# Patient Record
Sex: Male | Born: 1960 | Race: White | Hispanic: No | Marital: Married | Smoking: Never smoker
Health system: Southern US, Community
[De-identification: ages and names within clinical notes are randomized; demographics above are authoritative.]

## PROBLEM LIST (undated history)

## (undated) DIAGNOSIS — M1990 Unspecified osteoarthritis, unspecified site: Secondary | ICD-10-CM

## (undated) DIAGNOSIS — C7A8 Other malignant neuroendocrine tumors: Secondary | ICD-10-CM

## (undated) DIAGNOSIS — I10 Essential (primary) hypertension: Secondary | ICD-10-CM

## (undated) DIAGNOSIS — C7B8 Other secondary neuroendocrine tumors: Principal | ICD-10-CM

## (undated) HISTORY — PX: OTHER SURGICAL HISTORY: SHX169

## (undated) HISTORY — PX: HERNIA REPAIR: SHX51

---

## 2014-07-06 ENCOUNTER — Other Ambulatory Visit: Payer: Self-pay | Admitting: Hematology and Oncology

## 2014-07-06 DIAGNOSIS — C7A8 Other malignant neuroendocrine tumors: Secondary | ICD-10-CM

## 2014-07-06 DIAGNOSIS — C7B8 Other secondary neuroendocrine tumors: Principal | ICD-10-CM

## 2014-07-13 ENCOUNTER — Ambulatory Visit
Admission: RE | Admit: 2014-07-13 | Discharge: 2014-07-13 | Disposition: A | Discharge status: Home / Self Care | Payer: BC Managed Care – PPO | Source: Ambulatory Visit | Attending: Hematology and Oncology | Admitting: Hematology and Oncology

## 2014-07-13 DIAGNOSIS — C7A8 Other malignant neuroendocrine tumors: Secondary | ICD-10-CM

## 2014-07-13 DIAGNOSIS — C7B8 Other secondary neuroendocrine tumors: Principal | ICD-10-CM

## 2014-07-13 HISTORY — DX: Other secondary neuroendocrine tumors: C7B.8

## 2014-07-13 HISTORY — DX: Unspecified osteoarthritis, unspecified site: M19.90

## 2014-07-13 HISTORY — DX: Other malignant neuroendocrine tumors: C7A.8

## 2014-07-13 HISTORY — DX: Essential (primary) hypertension: I10

## 2014-07-13 NOTE — Consult Note (Signed)
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 Bilobar disease, slight interval progression by CT imaging despite Sandostatin. Assess for embolization therapy.   Referring Physician(s): Chinnasami,Bernard  History of Present Illness: Alex Adams is a 53 y.o. male with well-differentiated neuroendocrine tumor to the liver. He is currently receiving monthly Sandostatin injections. Overall he is doing very well. Recent MRI demonstrates multiple hepatic metastases. Patent portal vein. No biliary dilatation. The hepatic lesions demonstrate slight interval enlargement compared to prior CTs despite Sandostatin. He denies any current symptoms and remains asymptomatic.  Past Medical History  Diagnosis Date  . 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 Hypertension   . Arthritis     Past Surgical History  Procedure Laterality Date  . Hernia repair      Inguinal Hernia  . Arthroscopy of left knee with medial meniscus repair      Allergies: Dilaudid  Medications: Prior to Admission medications   Not on File    No family history on file.  History   Social History  . Marital Status: Married    Spouse Name: N/A    Number of Children: N/A  . Years of Education: N/A   Social History Main Topics  . Smoking status: Never Smoker   . Smokeless tobacco: Never Used  . Alcohol Use: No  . Drug Use: No  . Sexual Activity: Not on file   Other Topics Concern  . Not on file   Social History Narrative  . No narrative on file    ECOG Status: 0 - Asymptomatic  Review of Systems: A 12 point ROS discussed and pertinent positives are indicated in the HPI above.  All other systems are negative.  Review of Systems  Constitutional: Negative for fever, chills, diaphoresis, activity change and appetite change.  Respiratory: Negative for cough, chest tightness and shortness of breath.   Cardiovascular: Negative for chest pain and palpitations.    Gastrointestinal: Negative for abdominal distention.  Neurological: Negative for dizziness, weakness and headaches.    Vital Signs: BP 140/87  Pulse 91  Temp(Src) 97.3 F (36.3 C) (Oral)  Resp 14  Ht 5\' 6"  (1.676 m)  Wt 245 lb (111.131 kg)  BMI 39.56 kg/m2  SpO2 98%  Physical Exam  Constitutional: He appears well-developed and well-nourished. No distress.  Cardiovascular: Normal rate and regular rhythm.  Exam reveals no friction rub.   No murmur heard. Pulmonary/Chest: Effort normal and breath sounds normal. No respiratory distress. He has no wheezes.  Abdominal: Soft. Bowel sounds are normal. He exhibits no distension.  No palpable mass or organomegaly. No hernia.  Skin: He is not diaphoretic.  Psychiatric: He has a normal mood and affect.    Imaging: Most recent MRI and CT scans were reviewed with the patient and his wife. This demonstrates bilobar disease. Largest lesion is in the central right hepatic lobe.  Patent portal vein. No biliary dilatation. No adenopathy. Labs: Available has from 03/30/2014 creatinine 0.9. Normal LFTs. Total bilirubin 0.6. Total protein 6.8.  Assessment and Plan:  Well-differentiated neuroendocrine tumor to the liver with slight progression of bilobar hepatic metastases by imaging despite Sandostatin therapy. Based on imaging and lab data, the patient is an excellent candidate for Y90 embolization therapy. This procedure was discussed in detail including the preprocedure assessment as well as the treatment. He and his wife have a clear understanding of the procedure and and wish to proceed. Other embolization therapies were also discussed. After our discussion, I plan  to schedule for Y 90 embolization in the next few weeks at [facility].  Thank you for this interesting consult.  I greatly enjoyed meeting Alex Adams and look forward to participating in their care.    I spent a total of 30 minutes face to face in clinical  consultation, greater than 50% of which was counseling/coordinating care for metastatic neuroendocrine tumor.  SignedGreggory Keen T. 07/13/2014, 3:22 PM

## 2014-07-19 ENCOUNTER — Telehealth: Payer: Self-pay | Admitting: Emergency Medicine

## 2014-07-19 NOTE — Telephone Encounter (Signed)
CALLED PT TO MAKE HIM AWARE THAT INS. HAS APPROVED Y-90 PROCEDURE.   EVERYTHING HAS BEEN FAXED TO [facility] AND THEY WILL CONTACT PT DIRECTLY TO SET UP PROCEDURE.

## 2014-08-23 ENCOUNTER — Other Ambulatory Visit: Payer: Self-pay | Admitting: Interventional Radiology

## 2014-08-25 ENCOUNTER — Other Ambulatory Visit: Payer: Self-pay | Admitting: Radiology

## 2014-08-25 DIAGNOSIS — D3A8 Other benign neuroendocrine tumors: Secondary | ICD-10-CM

## 2014-08-30 ENCOUNTER — Other Ambulatory Visit: Payer: Self-pay | Admitting: Radiology

## 2014-08-30 DIAGNOSIS — C7B8 Other secondary neuroendocrine tumors: Secondary | ICD-10-CM | POA: Insufficient documentation

## 2014-09-06 ENCOUNTER — Inpatient Hospital Stay: Admission: RE | Admit: 2014-09-06 | Payer: BC Managed Care – PPO | Source: Ambulatory Visit

## 2014-09-06 ENCOUNTER — Other Ambulatory Visit: Payer: Self-pay | Admitting: Radiology

## 2014-09-28 ENCOUNTER — Other Ambulatory Visit: Payer: Self-pay | Admitting: Radiology

## 2014-09-28 DIAGNOSIS — C7B8 Other secondary neuroendocrine tumors: Secondary | ICD-10-CM

## 2014-09-29 ENCOUNTER — Other Ambulatory Visit: Payer: Self-pay | Admitting: Emergency Medicine

## 2014-09-29 DIAGNOSIS — C7B8 Other secondary neuroendocrine tumors: Principal | ICD-10-CM

## 2014-09-29 DIAGNOSIS — C7A8 Other malignant neuroendocrine tumors: Secondary | ICD-10-CM

## 2014-11-01 ENCOUNTER — Ambulatory Visit
Admission: RE | Admit: 2014-11-01 | Discharge: 2014-11-01 | Disposition: A | Discharge status: Home / Self Care | Payer: BC Managed Care – PPO | Source: Ambulatory Visit | Attending: Radiology | Admitting: Radiology

## 2014-11-01 DIAGNOSIS — C7B8 Other secondary neuroendocrine tumors: Secondary | ICD-10-CM

## 2014-11-01 HISTORY — PX: IR GENERIC HISTORICAL: IMG1180011

## 2014-11-01 NOTE — Progress Notes (Signed)
Patient ID: Alex Adams, male   DOB: 18-Mar-1961, 54 y.o.   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 Bilateral disease. Patient is a nonsurgical candidate. He is now status post right and left hepatic Y 90 radio embolizations. He returns for outpatient follow-up.  Referring Physician(s): Bruning,Kevin, Chinnasami, Bernard  History of Present Illness: Alex Adams is a 54 y.o. male 54 year old male with well-differentiated neuroendocrine tumor to the liver. He is status post right and left hepatic Y 90 radio embolizations performed at [facility] on 08/11/2014, 08/24/2014, and 09/27/2014. Both Y 90 embolizations required overnight observation admissions for hydration, pain management, and control of nausea. After the first right hepatic Y 90 embolization, he had a mild radiation-induced cholecystitis which resolved with conservative management including hydration, pain medication, and antibiotics. Patient has known cholelithiasis. The mild cholecystitis resolved prior to the left hepatic Y 90 embolization. He has recovered over the holidays however, on 10/23/2014 he had right upper quadrant pain compatible with biliary colic or symptomatic gallstones. He presented to the emergency room. CT at that time demonstrated no acute inflammatory process. Hepatic lesions are stable. No extrahepatic disease. He was discharged with nausea and pain medications.  Currently he describes intermittent right upper quadrant abdominal pain with meals despite a low-fat diet. Otherwise no significant abdominal pain related to the Y 90 embolizations. He reports 15 pound weight loss over the course of the embolization treatments. No interval fevers. 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  At this point his metastatic neuroendocrine tumor within the liver is stable following the bilateral Y 90 embolizations. He warrants no  further embolization at this point. For the neuroendocrine tumor, I plan to obtain a follow-up abdominal MRI in 4 months (May 2016).  Past Medical History  Diagnosis Date  . 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 Hypertension   . Arthritis     Past Surgical History  Procedure Laterality Date  . Hernia repair      Inguinal Hernia  . Arthroscopy of left knee with medial meniscus repair      Allergies: Dilaudid  Medications: Prior to Admission medications   Not on File    No family history on file.  History   Social History  . Marital Status: Married    Spouse Name: N/A    Number of Children: N/A  . Years of Education: N/A   Social History Main Topics  . Smoking status: Never Smoker   . Smokeless tobacco: Never Used  . Alcohol Use: No  . Drug Use: No  . Sexual Activity: Not on file   Other Topics Concern  . Not on file   Social History Narrative  . No narrative on file    ECOG Status: 0 - Asymptomatic  Review of Systems  Constitutional: Positive for appetite change. Negative for fever, diaphoresis, activity change and fatigue.  Respiratory: Negative for cough, chest tightness and shortness of breath.   Cardiovascular: Negative for chest pain and palpitations.  Gastrointestinal: Negative.  Negative for abdominal distention.  Genitourinary: Negative for dysuria and flank pain.  Psychiatric/Behavioral: Negative for agitation.    Vital Signs: BP 127/81 mmHg  Pulse 82  Temp(Src) 97.6 F (36.4 C) (Oral)  Resp 15  Ht 5\' 6"  (1.676 m)  Wt 231 lb (104.781 kg)  BMI 37.30 kg/m2  SpO2 99%  Physical Exam  Constitutional: He appears well-developed and well-nourished. No distress.  Cardiovascular: Normal rate, regular rhythm, normal heart sounds and intact distal pulses.  Exam reveals no friction rub.   No murmur heard. Pulmonary/Chest: Effort normal. No respiratory distress. He has no wheezes. He has rales.  Abdominal: Soft. Bowel sounds are normal.  He exhibits no distension.  No hepatosplenomegaly. No palpable mass.  Skin: Skin is warm and dry. No rash noted. He is not diaphoretic. No erythema. No pallor.  Psychiatric: He has a normal mood and affect. His behavior is normal. Judgment and thought content normal.    Imaging: [place] [facility] 10/23/2014 abdominal CT with contrast demonstrates stable hepatic lesions following Y 90 embolizations. Cholelithiasis noted. No current acute abdominal or pelvic inflammatory process.   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 Stable CT imaging without progression or extrahepatic disease. History of known gallstones. Embolization procedure was complicated by mild radiation-induced cholecystitis treated conservatively. He now has intermittent abdominal pain with meals in the right upper quadrant compatible with biliary colic or intermittent systematic gallstones. He has been referred to Dr. Christian Mate in [facility] to be evaluated for cholecystectomy. I called Dr. Christian Mate today to discuss his case over the phone.  For his neuroendocrine tumor, he needs surveillance imaging in 4 months in May 2016 with a abdominal MRI without and with contrast.   I spent a total of 30 minutes face to face in clinical consultation, greater than 50% of which was counseling/coordinating care for metastatic neuroendocrine tumor.  SignedGreggory Keen 11/01/2014, 12:29 PM

## 2015-02-13 ENCOUNTER — Other Ambulatory Visit (HOSPITAL_COMMUNITY): Payer: Self-pay | Admitting: Interventional Radiology

## 2015-02-13 ENCOUNTER — Other Ambulatory Visit: Payer: Self-pay | Admitting: *Deleted

## 2015-02-13 DIAGNOSIS — C7B8 Other secondary neuroendocrine tumors: Secondary | ICD-10-CM

## 2015-02-13 DIAGNOSIS — D3A8 Other benign neuroendocrine tumors: Secondary | ICD-10-CM

## 2015-03-14 ENCOUNTER — Ambulatory Visit
Admission: RE | Admit: 2015-03-14 | Discharge: 2015-03-14 | Disposition: A | Discharge status: Home / Self Care | Payer: BC Managed Care – PPO | Source: Ambulatory Visit | Attending: Interventional Radiology | Admitting: Interventional Radiology

## 2015-03-14 DIAGNOSIS — C7B8 Other secondary neuroendocrine tumors: Secondary | ICD-10-CM

## 2015-03-14 NOTE — Progress Notes (Signed)
Patient ID: Alex Adams, male   DOB: May 30, 1961, 54 y.o.   MRN: [id number]      Chief Complaint: Chief Complaint  Patient presents with  . Follow-up    follow up Y-90 S[facility] for liver mets    Referring Physician(s): Chinnasami  History of Present Illness: Alex Adams is a 54 y.o. male with well-differentiated neuroendocrine tumor to the liver. He is status post bilateral hepatic Y 90 radio embolizations performed at [facility] 08/24/2014 and 09/27/2014. He has recovered very well. He did develop mild radiation-induced cholecystitis which initially was treated with conservative management. He eventually elected to undergo elective outpatient laparoscopic cholecystectomy. His abdominal pain has resolved. He currently is asymptomatic. No physical limitations. He works full-time. Follow-up imaging demonstrates regression of the hepatic disease. No new or enlarging hepatic lesions. No extra hepatic disease. Currently he is stable on monthly Sandostatin injections. No new issues.  Past Medical History  Diagnosis Date  . 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 Hypertension   . Arthritis     Past Surgical History  Procedure Laterality Date  . Hernia repair      Inguinal Hernia  . Arthroscopy of left knee with medial meniscus repair      Allergies: Dilaudid  Medications: Prior to Admission medications   Not on File     No family history on file.  History   Social History  . Marital Status: Married    Spouse Name: N/A  . Number of Children: N/A  . Years of Education: N/A   Social History Main Topics  . Smoking status: Never Smoker   . Smokeless tobacco: Never Used  . Alcohol Use: No  . Drug Use: No  . Sexual Activity: Not on file   Other Topics Concern  . Not on file   Social History Narrative  . No narrative on file    ECOG Status: 0 - Asymptomatic  Review of Systems: A 12 point ROS discussed and pertinent positives are indicated in the HPI  above.  All other systems are negative.  Review of Systems  Constitutional: Positive for fatigue. Negative for fever, diaphoresis, activity change and appetite change.  Cardiovascular: Negative for chest pain and palpitations.  Gastrointestinal: Negative for abdominal distention.    Vital Signs: BP 136/91 mmHg  Pulse 72  Temp(Src) 97.9 F (36.6 C) (Oral)  Resp 14  Ht 5\' 6"  (1.676 m)  Wt 240 lb (108.863 kg)  BMI 38.76 kg/m2  SpO2 98%  Physical Exam  Constitutional: He appears well-developed and well-nourished. No distress.  Abdominal: Soft. Bowel sounds are normal. He exhibits no distension.  Skin: He is not diaphoretic.    Imaging: MRI abdomen 03/07/2015 demonstrates decrease in size of the dominant right hepatic lesion with residual peripheral enhancement. Other bilateral hepatic lesions are also smaller in size. No new or enlarging hepatic disease. No extrahepatic disease demonstrated. Interval cholecystectomy without complication.  Labs:  CBC: WBC 4.4, hemoglobin 15.8, platelet count 208 (01/31/2015)  BMP: Within normal limits. Creatinine 0.8. Glucose 135. (01/31/2015)  LIVER FUNCTION TESTS: Total bilirubin 0.8, normal LFTs. (01/31/2015)  TUMOR MARKERS: Chromogranin a 1.0, serotonin 307 (02/02/2015)  Assessment and Plan:  6 months status post bilateral Y 90 embolization therapy for well-differentiated neuroendocrine tumor to the liver. Follow-up posttreatment MRI imaging demonstrates regression of the dominant right hepatic mass. Small left hepatic lesions are also decreased in size. No new or enlarging hepatic disease. No extrahepatic disease appreciated. Since our last visit, the  patient elected to undergo elective laparoscopic cholecystectomy. He has recovered from this very well. No further biliary colic or abdominal pain. Overall he is doing very well. No physical limitations. He remains asymptomatic. Stable lab values. He remains on monthly Sandostatin injections.  He is followed closely by Dr. Wendee Beavers  No further treatment at this time. Continue surveillance imaging every 4 months with abdominal MRI without and with contrast.  Signed: Tera Pellicane 03/14/2015, 9:26 AM   I spent a total of    25 Minutes in face to face in clinical consultation, greater than 50% of which was counseling/coordinating care for this patient with metastatic neuroendocrine tumor to the liver.

## 2015-06-21 ENCOUNTER — Other Ambulatory Visit: Payer: Self-pay | Admitting: Radiology

## 2015-06-21 ENCOUNTER — Other Ambulatory Visit: Payer: Self-pay | Admitting: Interventional Radiology

## 2015-06-21 DIAGNOSIS — D3A8 Other benign neuroendocrine tumors: Secondary | ICD-10-CM

## 2015-06-23 ENCOUNTER — Other Ambulatory Visit: Payer: Self-pay | Admitting: *Deleted

## 2015-06-23 DIAGNOSIS — D3A8 Other benign neuroendocrine tumors: Secondary | ICD-10-CM

## 2015-07-18 ENCOUNTER — Ambulatory Visit
Admission: RE | Admit: 2015-07-18 | Discharge: 2015-07-18 | Disposition: A | Discharge status: Home / Self Care | Payer: BC Managed Care – PPO | Source: Ambulatory Visit | Attending: Interventional Radiology | Admitting: Interventional Radiology

## 2015-07-18 DIAGNOSIS — D3A8 Other benign neuroendocrine tumors: Secondary | ICD-10-CM | POA: Insufficient documentation

## 2015-07-18 NOTE — Progress Notes (Signed)
Patient ID: Alex Adams, male   DOB: May 06, 1961, 54 y.o.   MRN: [id number]       Chief Complaint: Patient was seen in consultation today for  Chief Complaint  Patient presents with  . Follow-up    follow up Y-90 SIRT     at the request of Shick,Michael  Referring Physician(s): chinnasami  History of Present Illness: Alex Adams is a 54 y.o. male with well-differentiated neuroendocrine tumor to the liver. He is now status post bilateral hepatic Y 90 radio embolizations performed at [facility] 08/24/2014 and 09/27/2014. His recovery was complicated by mild radiation-induced cholecystitis which initially was treated conservatively. He did eventually require an elective outpatient laparoscopic cholecystectomy. No further abdominal pain or symptoms. He continues to be asymptomatic. He works full-time and has no physical limitations. Surveillance MRI imaging demonstrates stable hepatic disease posttreatment. No new or enlarging lesions. No evidence of extra hepatic disease. He has been compliant with monthly Sandostatin injections. No flushing, tachycardia, or diarrhea.  Past Medical History  Diagnosis Date  . 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 Hypertension   . Arthritis     Past Surgical History  Procedure Laterality Date  . Hernia repair      Inguinal Hernia  . Arthroscopy of left knee with medial meniscus repair      Allergies: Dilaudid  Medications: Prior to Admission medications   Not on File     No family history on file.  Social History   Social History  . Marital Status: Married    Spouse Name: N/A  . Number of Children: N/A  . Years of Education: N/A   Social History Main Topics  . Smoking status: Never Smoker   . Smokeless tobacco: Never Used  . Alcohol Use: No  . Drug Use: No  . Sexual Activity: Not on file   Other Topics Concern  . Not on file   Social History Narrative  . No narrative on file    ECOG Status: 0 -  Asymptomatic  Review of Systems: A 12 point ROS discussed and pertinent positives are indicated in the HPI above.  All other systems are negative.  Review of Systems  Constitutional: Negative for fever, chills, diaphoresis, activity change, appetite change, fatigue and unexpected weight change.  Respiratory: Negative for cough, chest tightness and shortness of breath.   Cardiovascular: Negative for chest pain.  Gastrointestinal: Negative for nausea, vomiting, abdominal pain, diarrhea and abdominal distention.    Vital Signs: BP 145/88 mmHg  Pulse 73  Temp(Src) 97.9 F (36.6 C) (Oral)  Resp 15  SpO2 99%  Physical Exam  Constitutional: He appears well-developed and well-nourished. No distress.  Eyes: Conjunctivae are normal. No scleral icterus.  Cardiovascular: Normal rate, regular rhythm, normal heart sounds and intact distal pulses.   No murmur heard. Pulmonary/Chest: Effort normal and breath sounds normal. No respiratory distress. He exhibits no tenderness.  Abdominal: Soft. Bowel sounds are normal. He exhibits no distension and no mass. There is no tenderness. No hernia.  Skin: Skin is warm and dry. No rash noted. He is not diaphoretic. No erythema.  Psychiatric: He has a normal mood and affect. His behavior is normal. Judgment and thought content normal.    Imaging: MRI 07/11/2015 demonstrates stable hepatic metastases without interval change compared to 03/07/2015. Dominant right hepatic central lesion has residual peripheral enhancement. No disease progression. No extrahepatic disease.  Labs: 06/27/2015: Creatinine 0.85, bilirubin 0.8, AST 39, ALT 46, alkaline phosphatase 106,  hemoglobin 15.8, platelet count 158  Assessment and Plan:  Metastatic carcinoid tumor to the liver, status post hepatic Y 90 radio embolizations 08/24/2014 and 09/27/2014. Surveillance imaging demonstrates stable hepatic disease without any new or enlarging lesions. No extra hepatic disease. He  remains stable on monthly Sandostatin injections. No current abdominal pain or symptoms.  He will remain on monthly Sandostatin injections. He continues to be followed closely by Dr. Ferne Coe centimeters.  No further embolization therapy necessary at this time. Continue close surveillance imaging every 4 months with abdominal MRI without and with contrast.  Signed: SHICK, MICHAEL 07/18/2015, 8:34 AM   I spent a total of    25 Minutes in face to face in clinical consultation, greater than 50% of which was counseling/coordinating care for with metastatic neuroendocrine tumor to the liver.

## 2015-11-01 ENCOUNTER — Other Ambulatory Visit: Payer: Self-pay | Admitting: Interventional Radiology

## 2015-11-01 DIAGNOSIS — D3A8 Other benign neuroendocrine tumors: Secondary | ICD-10-CM

## 2015-12-05 ENCOUNTER — Ambulatory Visit
Admission: RE | Admit: 2015-12-05 | Discharge: 2015-12-05 | Disposition: A | Discharge status: Home / Self Care | Payer: BC Managed Care – PPO | Source: Ambulatory Visit | Attending: Interventional Radiology | Admitting: Interventional Radiology

## 2015-12-05 DIAGNOSIS — D3A8 Other benign neuroendocrine tumors: Secondary | ICD-10-CM

## 2015-12-05 NOTE — Progress Notes (Signed)
Patient ID: Alex Adams, male   DOB: 1961-03-20, 55 y.o.   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    Referring Physician(s): Chinnasami  History of Present Illness: Alex Adams is a 55 y.o. 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 He is status post bilateral hepatic Y 90 radio embolizations performed at [facility] 08/24/2014 and 09/27/2014 area he returns for outpatient follow-up and review his surveillance imaging. Since his last visit he remains asymptomatic. No current abdominal pain or flank pain. No physical limitations. He continues to work full-time. No episodes of tachycardia, flushing, or diarrhea. Follow-up imaging continues to be stable with no progression of hepatic disease. No new or enlarging disease or extra hepatic disease. He remains on monthly Sandostatin injections. No new issues or complaints.  Past Medical History  Diagnosis Date  . 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 Hypertension   . Arthritis     Past Surgical History  Procedure Laterality Date  . Hernia repair      Inguinal Hernia  . Arthroscopy of left knee with medial meniscus repair      Allergies: Dilaudid  Medications: Prior to Admission medications   Not on File     No family history on file.  Social History   Social History  . Marital Status: Married    Spouse Name: N/A  . Number of Children: N/A  . Years of Education: N/A   Social History Main Topics  . Smoking status: Never Smoker   . Smokeless tobacco: Never Used  . Alcohol Use: No  . Drug Use: No  . Sexual Activity: Not on file   Other Topics Concern  . Not on file   Social History Narrative  . No narrative on file    ECOG Status: 0 - Asymptomatic  Review of Systems: A 12 point ROS discussed and pertinent positives are indicated in the HPI above.  All other systems are negative.  Review of Systems  Vital Signs: BP  130/88 mmHg  Pulse 78  Temp(Src) 97.8 F (36.6 C) (Oral)  Resp 14  Ht 5\' 6"  (1.676 m)  Wt 250 lb (113.399 kg)  BMI 40.37 kg/m2  Physical Exam  Constitutional: He appears well-developed and well-nourished. No distress.  Cardiovascular: Normal rate and regular rhythm.  Exam reveals no friction rub.   No murmur heard. Pulmonary/Chest: Effort normal. No respiratory distress. He has no wheezes. He has no rales.  Abdominal: Soft. Bowel sounds are normal. He exhibits no distension. There is no tenderness.  Skin: Skin is warm and dry. No rash noted. He is not diaphoretic. No erythema.     Imaging: Abdominal MRI with contrast 10/13/2015 demonstrates stable residual hepatic lesions. No new or enlarging hepatic abnormality. No extra hepatic disease. No significant change compared to the previous exams over the last year.   Labs:  CBC: No results for input(s): WBC, HGB, HCT, PLT in the last 8760 hours.  COAGS: No results for input(s): INR, APTT in the last 8760 hours.  BMP: No results for input(s): NA, K, CL, CO2, GLUCOSE, BUN, CALCIUM, CREATININE, GFRNONAA, GFRAA in the last 8760 hours.  Invalid input(s): CMP  LIVER FUNCTION TESTS: No results for input(s): BILITOT, AST, ALT, ALKPHOS, PROT, ALBUMIN in the last 8760 hours.  TUMOR MARKERS: No results for input(s): AFPTM, CEA, CA199, CHROMGRNA in the last 8760 hours.  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 Follow-up posttreatment MRI imaging has been performed every 4 months. No significant change in the dominant central right hepatic mass as well as other small bilateral hepatic lesions. No new or enlarging hepatic disease. No extrahepatic disease appreciated. Overall he is at his baseline. No current biliary colic or abdominal pain. No physical limitations. He remains asymptomatic. He continues on monthly Sandostatin injections. He is closely  followed by Dr. Wendee Beavers.  Plan: Continue surveillance imaging every 6 months with abdominal MRI without and with contrast. No further radio embolization of this time.    Electronically Signed: Greggory Keen 12/05/2015, 4:32 PM   I spent a total of    25 Minutes in face to face in clinical consultation, greater than 50% of which was counseling/coordinating care for this patient with metastatic neuroendocrine tumor to the liver.

## 2016-05-08 ENCOUNTER — Other Ambulatory Visit (HOSPITAL_COMMUNITY): Payer: Self-pay | Admitting: Interventional Radiology

## 2016-05-08 ENCOUNTER — Other Ambulatory Visit: Payer: Self-pay | Admitting: Radiology

## 2016-05-08 DIAGNOSIS — C7B8 Other secondary neuroendocrine tumors: Secondary | ICD-10-CM

## 2016-05-08 DIAGNOSIS — C787 Secondary malignant neoplasm of liver and intrahepatic bile duct: Secondary | ICD-10-CM

## 2016-05-21 ENCOUNTER — Telehealth: Payer: Self-pay | Admitting: Radiology

## 2016-05-21 NOTE — Telephone Encounter (Signed)
Phoned patient with appointment info:  MR abd w/ & w/o contrast:  Wed, May 29, 2016 at 9:15 am at [facility].  To arrive at 8:45 am.  Liqs only x 4 hrs prior.  May take routine medications.  Appointment with Dr. Annamaria BootsBeatris Ship, Jun 04, 2016 at 8 am.  To arrive at 7:45 am.    Reece Levy, RN 05/21/2016 8:32 AM

## 2016-06-04 ENCOUNTER — Ambulatory Visit
Admission: RE | Admit: 2016-06-04 | Discharge: 2016-06-04 | Disposition: A | Discharge status: Home / Self Care | Payer: BC Managed Care – PPO | Source: Ambulatory Visit | Attending: Interventional Radiology | Admitting: Interventional Radiology

## 2016-06-04 DIAGNOSIS — C7B8 Other secondary neuroendocrine tumors: Secondary | ICD-10-CM

## 2016-06-04 HISTORY — PX: IR GENERIC HISTORICAL: IMG1180011

## 2016-06-04 NOTE — Progress Notes (Signed)
Patient ID: Alex Adams, male   DOB: Oct 29, 1960, 55 y.o.   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 Status post Y 90 radio embolization.  Referring Physician(s): Chinnasami  History of Present Illness: Alex Adams is a 55 y.o. 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 He is well-known to our service. He is status post bilateral hepatic Y 90 radio embolizations performed at [facility] 08/24/2014 and 09/27/2014. His embolizations were complicated by cholecystitis. This ultimately require cholecystectomy. He now returns for outpatient follow-up and review of his surveillance imaging. He continues to do very well. Excellent functional status. ECOG status 0. He remains asymptomatic. No current abdominal pain, flank pain, jaundice, flushing, rapid heart rate, or diarrhea. No physical limitations. Stable weight and he continues to work full-time. Surveillance MRI imaging continues to demonstrate stable hepatic disease. No interval change or progression. No new or enlarging lesions are extrahepatic disease. 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 He continues on Sandostatin injections monthly. No new issues or complaints. He has traveled extensively this summer.  Past Medical History:  Diagnosis Date  . Arthritis   . Hypertension   . Neuroendocrine carcinoma metastatic to liver [facility])     Past Surgical History:  Procedure Laterality Date  . Arthroscopy of Left Knee with Medial Meniscus Repair    . HERNIA REPAIR     Inguinal Hernia    Allergies: Dilaudid [hydromorphone hcl]  Medications: Prior to Admission medications   Medication Sig Start Date End Date Taking? Authorizing Provider  sildenafil (REVATIO) 20 MG tablet 2-5 tabs po 1-2 hrs before sex prn 02/20/16  Yes Historical Provider, MD     No family history on file.  Social History   Social History  . Marital status:  Married    Spouse name: N/A  . Number of children: N/A  . Years of education: N/A   Social History Main Topics  . Smoking status: Never Smoker  . Smokeless tobacco: Never Used  . Alcohol use No  . Drug use: No  . Sexual activity: Not Asked   Other Topics Concern  . None   Social History Narrative  . None    ECOG Status: 0 - Asymptomatic  Review of Systems: A 12 point ROS discussed and pertinent positives are indicated in the HPI above.  All other systems are negative.  Review of Systems  Vital Signs: BP 131/90 (BP Location: Right Arm, Patient Position: Sitting, Cuff Size: Normal)   Pulse 71   Temp 97.8 F (36.6 C)   Resp 14   Ht 5\' 6"  (1.676 m)   Wt 245 lb (111.1 kg)   SpO2 98%   BMI 39.54 kg/m   Physical Exam  Constitutional: He is oriented to person, place, and time. He appears well-developed and well-nourished. No distress.  Eyes: No scleral icterus.  Neck: Normal range of motion. Neck supple.  Cardiovascular: Normal rate, regular rhythm, normal heart sounds and intact distal pulses.  Exam reveals no gallop and no friction rub.   No murmur heard. Pulmonary/Chest: Effort normal and breath sounds normal. No respiratory distress. He has no wheezes. He has no rales.  Abdominal: Soft. Bowel sounds are normal. He exhibits no distension. There is no tenderness. There is no guarding.  Lymphadenopathy:    He has no cervical adenopathy.  Neurological: He is alert and oriented to person, place, and time.  Skin: He is not diaphoretic.  Psychiatric: He has a normal mood and affect. His behavior is normal. Thought content normal.     Imaging:  MRI imaging with contrast performed at [facility] 05/29/2016. This demonstrated no change in the hypervascular liver lesion in the right lobe. No new liver lesions. Stable hepatic steatosis with interval progression of cirrhosis and hepatic fibrosis. No extrahepatic disease. Labs:  Available labs from 04/08/2016:   LFTs within  normal limits. Total bilirubin 0.6.  Chromogranin a 28 previously 26. Serotonin 294, previously 335   Assessment and Plan:  1.5 year status post bilateral Y 90 radio embolization for well-differentiated neuroendocrine tumor to the liver. Follow-up MRI demonstrates stable hepatic disease with progression of hepatic cirrhosis and fibrosis. No new or enlarging hepatic disease. No extrahepatic disease appreciated. He remains asymptomatic and at his baseline.  Plan: Continue monthly Sandostatin injections and close follow-up with Dr.Chinnasami.  Continue surveillance imaging every 6 months with abdominal MRI without and with contrast. No indication for additional radio embolization at this time.  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

## 2016-10-23 ENCOUNTER — Encounter: Payer: Self-pay | Admitting: Interventional Radiology

## 2016-10-24 ENCOUNTER — Encounter: Payer: Self-pay | Admitting: Interventional Radiology

## 2016-11-19 ENCOUNTER — Other Ambulatory Visit (HOSPITAL_COMMUNITY): Payer: Self-pay | Admitting: Interventional Radiology

## 2016-11-19 DIAGNOSIS — C7A8 Other malignant neuroendocrine tumors: Secondary | ICD-10-CM

## 2016-11-19 DIAGNOSIS — C7B8 Other secondary neuroendocrine tumors: Principal | ICD-10-CM

## 2020-04-28 ENCOUNTER — Other Ambulatory Visit (HOSPITAL_COMMUNITY): Payer: Self-pay | Admitting: Hematology and Oncology

## 2020-04-28 DIAGNOSIS — C7B8 Other secondary neuroendocrine tumors: Secondary | ICD-10-CM

## 2020-05-02 ENCOUNTER — Ambulatory Visit (HOSPITAL_COMMUNITY)
Admission: RE | Admit: 2020-05-02 | Discharge: 2020-05-02 | Disposition: A | Discharge status: Home / Self Care | Payer: BC Managed Care – PPO | Source: Ambulatory Visit | Attending: Hematology and Oncology | Admitting: Hematology and Oncology

## 2020-05-02 ENCOUNTER — Other Ambulatory Visit: Payer: Self-pay

## 2020-05-02 DIAGNOSIS — C7B8 Other secondary neuroendocrine tumors: Secondary | ICD-10-CM | POA: Insufficient documentation

## 2020-05-02 NOTE — Consult Note (Signed)
Chief Complaint: Patient with metastatic neuroendocrine tumor was for  evaluation Peptide receptor radiotherapy (PRRT) with YS063 DOTATATE (Lutathera).  Referring Physician(s):Chinnasami    Patient Status: [facility] - Out-pt  History of Present Illness: Alex Adams is a 59 y.o. male long history of metastatic carcinoid well differentiated neuroendocrine tumor with liver metastasis or mesenteric metastasis.   Patient status post yttrium 90 radio embolization of LEFT RIGHT hepatic lobe November 2015.  Patient currently receiving monthly Sandostatin injections of 60 mg.    Recent DOTATATE PET-CT imaging demonstrate mild progression of hepatic metastasis.    Patient has recently documented increasing serotonin levels.    Past Medical History:  Diagnosis Date  . Arthritis   . Hypertension   . Neuroendocrine carcinoma metastatic to liver (HCC)      Allergies: Dilaudid [hydromorphone hcl]  Medications: Prior to Admission medications   Medication Sig Start Date End Date Taking? Authorizing Provider  sildenafil (REVATIO) 20 MG tablet 2-5 tabs po 1-2 hrs before sex prn 02/20/16   [provider]     No family history on file.  Social History   Socioeconomic History  . Marital status: Married    Spouse name: Not on file  . Number of children: Not on file  . Years of education: Not on file  . Highest education level: Not on file  Occupational History  . Not on file  Tobacco Use  . Smoking status: Never Smoker  . Smokeless tobacco: Never Used  Substance and Sexual Activity  . Alcohol use: No  . Drug use: No  . Sexual activity: Not on file  Other Topics Concern  . Not on file  Social History Narrative  . Not on file   Social Determinants of Health   Financial Resource Strain:   . Difficulty of Paying Living Expenses:   Food Insecurity:   . Worried About Charity fundraiser in the Last Year:   . Arboriculturist in the Last Year:   Transportation Needs:    . Film/video editor (Medical):   Marland Kitchen Lack of Transportation (Non-Medical):   Physical Activity:   . Days of Exercise per Week:   . Minutes of Exercise per Session:   Stress:   . Feeling of Stress :   Social Connections:   . Frequency of Communication with Friends and Family:   . Frequency of Social Gatherings with Friends and Family:   . Attends Religious Services:   . Active Member of Clubs or Organizations:   . Attends Archivist Meetings:   Marland Kitchen Marital Status:     ECOG Status: 1 - Symptomatic but completely ambulatory  Review of Systems: A 12 point ROS discussed and pertinent positives are indicated in the HPI above.  All other systems are negative.  Review of Systems  Constitutional: Negative.   HENT: Negative.   Eyes: Negative.   Gastrointestinal: Positive for diarrhea.  Endocrine: Negative.   Genitourinary: Negative.   Musculoskeletal: Negative.   Allergic/Immunologic: Negative.   Neurological: Negative.   Hematological: Negative.   Psychiatric/Behavioral: Negative.     Vital Signs: There were no vitals taken for this visit.  Physical Exam  Deferred for COVID precautions Imaging: No results found.  Labs:  CBC: Normal CBC at [facility] - mild anemia  COAGS:  BMP: Cr - 1.27  GFR = 62 Billi = 0.8  LIVER FUNCTION TESTS: Normal -- Billi = 0.8 TUMOR MARKERS: Chromagranin A = 65  - wnl  ELEVATED  Serotonin - 645 (03/14/20) increased from 249 (09/13/19  Assessment and Plan:  Alex Adams is a candidate for peptide receptor radiotherapy.  Patient has well differentiated neuroendocrine tumor identified within the liver.  The tumor is positive for DOTATATE avid somatostatin receptors.  Evidence for progression by DOTATATE PET and increased serotonin.  Patient has moderate diarrhea.  No flushing or hypotension.    The patient was counseled on the primary goal of therapy which is prolongation of progression free survival (79% improvement over standard  therapy, NETTER III trial).  Survival data accruing.  Secondary goals would include decrease in tumor burden (20% response) and decrease in carcinoid symptoms.    Primary of toxicities of therapy were explained to patient including marrow suppression, renal toxicity and hepatic toxicity.  Rare toxicity of myelosuppression and leukemia also explained.  Potential toxicity will be monitored throughout the course of therapy with interval  CBC and CMP laboratory evaluation. Currently lab values within normal limits.       Thank you for this interesting consult.  I greatly enjoyed meeting Alex Adams and look forward to participating in their care.  A copy of this report was sent to the requesting provider on this date.  Electronically Signed: Rennis Golden, MD 05/02/2020, 2:56 PM   I spent a total of  40 Minutes   in face to face in clinical consultation, greater than 50% of which was counseling/coordinating care for metastatic neuroendocrine tumor.

## 2020-05-10 ENCOUNTER — Other Ambulatory Visit (HOSPITAL_COMMUNITY): Payer: Self-pay | Admitting: Hematology and Oncology

## 2020-05-10 DIAGNOSIS — D3A8 Other benign neuroendocrine tumors: Secondary | ICD-10-CM

## 2020-05-30 ENCOUNTER — Ambulatory Visit (HOSPITAL_COMMUNITY)
Admission: RE | Admit: 2020-05-30 | Discharge: 2020-05-30 | Disposition: A | Discharge status: Home / Self Care | Payer: BC Managed Care – PPO | Source: Ambulatory Visit | Attending: Hematology and Oncology | Admitting: Hematology and Oncology

## 2020-05-30 ENCOUNTER — Other Ambulatory Visit: Payer: Self-pay

## 2020-05-30 DIAGNOSIS — D3A8 Other benign neuroendocrine tumors: Secondary | ICD-10-CM | POA: Diagnosis not present

## 2020-05-30 LAB — CBC WITH DIFFERENTIAL/PLATELET
Abs Immature Granulocytes: 0.01 10*3/uL (ref 0.00–0.07)
Basophils Absolute: 0 10*3/uL (ref 0.0–0.1)
Basophils Relative: 1 %
Eosinophils Absolute: 0.1 10*3/uL (ref 0.0–0.5)
Eosinophils Relative: 3 %
HCT: 41.8 % (ref 39.0–52.0)
Hemoglobin: 14.9 g/dL (ref 13.0–17.0)
Immature Granulocytes: 0 %
Lymphocytes Relative: 32 %
Lymphs Abs: 1.5 10*3/uL (ref 0.7–4.0)
MCH: 33.3 pg (ref 26.0–34.0)
MCHC: 35.6 g/dL (ref 30.0–36.0)
MCV: 93.3 fL (ref 80.0–100.0)
Monocytes Absolute: 0.5 10*3/uL (ref 0.1–1.0)
Monocytes Relative: 10 %
Neutro Abs: 2.6 10*3/uL (ref 1.7–7.7)
Neutrophils Relative %: 54 %
Platelets: 189 10*3/uL (ref 150–400)
RBC: 4.48 MIL/uL (ref 4.22–5.81)
RDW: 13.2 % (ref 11.5–15.5)
WBC: 4.7 10*3/uL (ref 4.0–10.5)
nRBC: 0 % (ref 0.0–0.2)

## 2020-05-30 LAB — COMPREHENSIVE METABOLIC PANEL
ALT: 38 U/L (ref 0–44)
AST: 34 U/L (ref 15–41)
Albumin: 4 g/dL (ref 3.5–5.0)
Alkaline Phosphatase: 61 U/L (ref 38–126)
Anion gap: 9 (ref 5–15)
BUN: 12 mg/dL (ref 6–20)
CO2: 23 mmol/L (ref 22–32)
Calcium: 8.8 mg/dL — ABNORMAL LOW (ref 8.9–10.3)
Chloride: 103 mmol/L (ref 98–111)
Creatinine, Ser: 0.87 mg/dL (ref 0.61–1.24)
GFR calc Af Amer: >60 mL/min (ref >60)
GFR calc non Af Amer: >60 mL/min (ref >60)
Glucose, Bld: 163 mg/dL — ABNORMAL HIGH (ref 70–99)
Potassium: 4.1 mmol/L (ref 3.5–5.1)
Sodium: 135 mmol/L (ref 135–145)
Total Bilirubin: 1.1 mg/dL (ref 0.3–1.2)
Total Protein: 6.9 g/dL (ref 6.5–8.1)

## 2020-05-30 MED ORDER — LUTETIUM LU 177 DOTATATE 370 MBQ/ML IV SOLN
200.0000 | Freq: Once | INTRAVENOUS | Status: AC
  Administered 2020-05-30: 200 via INTRAVENOUS

## 2020-05-30 MED ORDER — PROCHLORPERAZINE EDISYLATE 10 MG/2ML IJ SOLN
INTRAMUSCULAR | Status: AC
  Filled 2020-05-30: qty 2

## 2020-05-30 MED ORDER — PROCHLORPERAZINE EDISYLATE 10 MG/2ML IJ SOLN
10.0000 mg | Freq: Four times a day (QID) | INTRAMUSCULAR | Status: DC | PRN
  Administered 2020-05-30: 10 mg via INTRAVENOUS

## 2020-05-30 MED ORDER — SODIUM CHLORIDE 0.9 % IV SOLN
8.0000 mg | Freq: Once | INTRAVENOUS | Status: AC
  Administered 2020-05-30: 8 mg via INTRAVENOUS
  Filled 2020-05-30: qty 4

## 2020-05-30 MED ORDER — ONDANSETRON HCL 8 MG PO TABS: 8.0000 mg | ORAL_TABLET | Freq: Two times a day (BID) | ORAL | 0 refills | Status: DC | PRN

## 2020-05-30 MED ORDER — OCTREOTIDE ACETATE 30 MG IM KIT: 30.0000 mg | PACK | Freq: Once | INTRAMUSCULAR | Status: DC

## 2020-05-30 MED ORDER — OCTREOTIDE ACETATE 500 MCG/ML IJ SOLN
INTRAMUSCULAR | Status: AC
  Filled 2020-05-30: qty 1

## 2020-05-30 MED ORDER — OCTREOTIDE ACETATE 30 MG IM KIT
60.0000 mg | PACK | Freq: Once | INTRAMUSCULAR | Status: AC
  Administered 2020-05-30: 60 mg via INTRAMUSCULAR

## 2020-05-30 MED ORDER — OCTREOTIDE ACETATE 30 MG IM KIT
PACK | INTRAMUSCULAR | Status: AC
  Filled 2020-05-30: qty 1

## 2020-05-30 MED ORDER — AMINO ACID RADIOPROTECTANT - L-LYSINE 2.5%/L-ARGININE 2.5% IN NS
250.0000 mL/h | INTRAVENOUS | Status: AC
  Administered 2020-05-30: 250 mL/h via INTRAVENOUS
  Filled 2020-05-30: qty 1000

## 2020-05-30 MED ORDER — OCTREOTIDE ACETATE 500 MCG/ML IJ SOLN: 500.0000 ug | Freq: Once | INTRAMUSCULAR | Status: DC | PRN

## 2020-05-30 MED ORDER — SODIUM CHLORIDE 0.9 % IV SOLN: 500.0000 mL | Freq: Once | INTRAVENOUS | Status: DC

## 2020-05-30 NOTE — Progress Notes (Signed)
Infusion: The nuclear medicine technologist and I personally verified the dose activity ([203] mCi) to be delivered as specified in the written directive (200 mCi), and verified the patient identification via 2 separate methods. 20 gauge IV were started in the antecubital veins. Anti-emetics were administered by nursing staff. Amino acid renal protection was initiated 30 minutes prior to Lu 177 DOTATATE (Lutathera) infusion and continued continuously for 4 hours. Lutathera infusion was administered over 30 minutes.   The total administered dose was [201] mCi Lu 177 DOTATATE.  The entire IV tubing, venocatheter, stopcock and syringes was removed in total, placed in a disposal bag and sent for assay of the residual activity, which will be reported at a later time in our EMR by the physics staff. Pressure was applied to the venipuncture sites, and a compression bandage placed. Radiation Safety personnel were present to perform the discharge survey, as detailed on their documentation.  Patient received 60 mg IM long-acting Sandostatin injection 4 hours after Lutathera effusion in the nuclear medicine department.  RADIOPHARMACEUTICALS:  [201] mCi Lu 177 DOTATATE  FINDINGS: Diagnosis: [Metastatic neuroendocrine tumor.]  Current Infusion: [1]  Planned Infusions: [4]  Patient reports persistent diarrhea.  Otherwise no symptomology.. The patient's most recent blood counts were reviewed and remains a good candidate to proceed with Lutathera. The patient was situated in an infusion suite and administered Lutathera as above.  The patient did experience mild-to-moderate nausea without vomiting.  Several episodes of diarrhea.   Patient was administered additional dose of Compazine for nausea.   Patient will follow-up with referring oncologist for interval serum laboratories (CBC and CMP) in approximately 4 weeks.    Patient received 60 mg IM long-acting Sandostatin injection 4 hours after  Lutathera effusion in the nuclear medicine department.   IMPRESSION: [First] XL 244 [id number] treatment for metastatic neuroendocrine tumor. The patient tolerated the infusion well with some mild to moderate nausea without vomiting..   The patient will return in 4 weeks for Sandostatin injection and 8 weeks for second therapy.

## 2020-06-12 ENCOUNTER — Other Ambulatory Visit (HOSPITAL_COMMUNITY): Payer: Self-pay | Admitting: Hematology and Oncology

## 2020-06-12 ENCOUNTER — Other Ambulatory Visit (HOSPITAL_COMMUNITY): Payer: Self-pay | Admitting: Diagnostic Radiology

## 2020-06-12 DIAGNOSIS — D3A8 Other benign neuroendocrine tumors: Secondary | ICD-10-CM

## 2020-06-13 ENCOUNTER — Other Ambulatory Visit (HOSPITAL_COMMUNITY): Payer: Self-pay | Admitting: Diagnostic Radiology

## 2020-06-13 DIAGNOSIS — D3A8 Other benign neuroendocrine tumors: Secondary | ICD-10-CM

## 2020-06-20 ENCOUNTER — Other Ambulatory Visit: Payer: Self-pay

## 2020-06-20 ENCOUNTER — Ambulatory Visit (HOSPITAL_COMMUNITY)
Admission: RE | Admit: 2020-06-20 | Discharge: 2020-06-20 | Disposition: A | Discharge status: Home / Self Care | Payer: BC Managed Care – PPO | Source: Ambulatory Visit | Attending: Diagnostic Radiology | Admitting: Diagnostic Radiology

## 2020-06-20 DIAGNOSIS — D3A8 Other benign neuroendocrine tumors: Secondary | ICD-10-CM | POA: Insufficient documentation

## 2020-06-20 MED ORDER — OCTREOTIDE ACETATE 30 MG IM KIT
PACK | INTRAMUSCULAR | Status: AC
  Filled 2020-06-20: qty 1

## 2020-06-27 ENCOUNTER — Ambulatory Visit (HOSPITAL_COMMUNITY)
Admission: RE | Admit: 2020-06-27 | Discharge: 2020-06-27 | Disposition: A | Discharge status: Home / Self Care | Payer: BC Managed Care – PPO | Source: Ambulatory Visit | Attending: Diagnostic Radiology | Admitting: Diagnostic Radiology

## 2020-06-27 ENCOUNTER — Other Ambulatory Visit: Payer: Self-pay

## 2020-06-27 DIAGNOSIS — D3A8 Other benign neuroendocrine tumors: Secondary | ICD-10-CM | POA: Diagnosis not present

## 2020-06-27 DIAGNOSIS — C787 Secondary malignant neoplasm of liver and intrahepatic bile duct: Secondary | ICD-10-CM | POA: Diagnosis not present

## 2020-06-27 MED ORDER — OCTREOTIDE ACETATE 30 MG IM KIT
PACK | INTRAMUSCULAR | Status: AC
  Administered 2020-06-27: 60 mg via INTRAMUSCULAR
  Filled 2020-06-27: qty 2

## 2020-06-27 MED ORDER — OCTREOTIDE ACETATE 30 MG IM KIT: 60.0000 mg | PACK | Freq: Once | INTRAMUSCULAR | Status: AC

## 2020-07-25 ENCOUNTER — Ambulatory Visit (HOSPITAL_COMMUNITY): Admission: RE | Admit: 2020-07-25 | Payer: BC Managed Care – PPO | Source: Ambulatory Visit

## 2020-08-01 ENCOUNTER — Ambulatory Visit (HOSPITAL_COMMUNITY): Payer: BC Managed Care – PPO

## 2020-08-02 ENCOUNTER — Other Ambulatory Visit: Payer: Self-pay

## 2020-08-02 ENCOUNTER — Ambulatory Visit (HOSPITAL_COMMUNITY)
Admission: RE | Admit: 2020-08-02 | Discharge: 2020-08-02 | Disposition: A | Discharge status: Home / Self Care | Payer: BC Managed Care – PPO | Source: Ambulatory Visit | Attending: Hematology and Oncology | Admitting: Hematology and Oncology

## 2020-08-02 DIAGNOSIS — D3A8 Other benign neuroendocrine tumors: Secondary | ICD-10-CM | POA: Insufficient documentation

## 2020-08-02 LAB — CBC WITH DIFFERENTIAL/PLATELET
Abs Immature Granulocytes: 0.01 10*3/uL (ref 0.00–0.07)
Basophils Absolute: 0 10*3/uL (ref 0.0–0.1)
Basophils Relative: 1 %
Eosinophils Absolute: 0.1 10*3/uL (ref 0.0–0.5)
Eosinophils Relative: 2 %
HCT: 39.3 % (ref 39.0–52.0)
Hemoglobin: 14 g/dL (ref 13.0–17.0)
Immature Granulocytes: 0 %
Lymphocytes Relative: 25 %
Lymphs Abs: 0.8 10*3/uL (ref 0.7–4.0)
MCH: 33.6 pg (ref 26.0–34.0)
MCHC: 35.6 g/dL (ref 30.0–36.0)
MCV: 94.2 fL (ref 80.0–100.0)
Monocytes Absolute: 0.5 10*3/uL (ref 0.1–1.0)
Monocytes Relative: 16 %
Neutro Abs: 1.8 10*3/uL (ref 1.7–7.7)
Neutrophils Relative %: 56 %
Platelets: 128 10*3/uL — ABNORMAL LOW (ref 150–400)
RBC: 4.17 MIL/uL — ABNORMAL LOW (ref 4.22–5.81)
RDW: 13.4 % (ref 11.5–15.5)
WBC: 3.3 10*3/uL — ABNORMAL LOW (ref 4.0–10.5)
nRBC: 0 % (ref 0.0–0.2)

## 2020-08-02 LAB — COMPREHENSIVE METABOLIC PANEL
ALT: 51 U/L — ABNORMAL HIGH (ref 0–44)
AST: 42 U/L — ABNORMAL HIGH (ref 15–41)
Albumin: 3.9 g/dL (ref 3.5–5.0)
Alkaline Phosphatase: 63 U/L (ref 38–126)
Anion gap: 11 (ref 5–15)
BUN: 14 mg/dL (ref 6–20)
CO2: 23 mmol/L (ref 22–32)
Calcium: 8.9 mg/dL (ref 8.9–10.3)
Chloride: 102 mmol/L (ref 98–111)
Creatinine, Ser: 0.87 mg/dL (ref 0.61–1.24)
GFR, Estimated: >60 mL/min (ref >60)
Glucose, Bld: 179 mg/dL — ABNORMAL HIGH (ref 70–99)
Potassium: 4.1 mmol/L (ref 3.5–5.1)
Sodium: 136 mmol/L (ref 135–145)
Total Bilirubin: 0.9 mg/dL (ref 0.3–1.2)
Total Protein: 7.2 g/dL (ref 6.5–8.1)

## 2020-08-02 MED ORDER — ONDANSETRON HCL 8 MG PO TABS: 8.0000 mg | ORAL_TABLET | Freq: Two times a day (BID) | ORAL | 0 refills | Status: DC | PRN

## 2020-08-02 MED ORDER — LORAZEPAM 2 MG/ML IJ SOLN
1.0000 mg | Freq: Once | INTRAMUSCULAR | Status: AC
  Administered 2020-08-02: 1 mg via INTRAVENOUS
  Filled 2020-08-02: qty 0.5

## 2020-08-02 MED ORDER — OCTREOTIDE ACETATE 500 MCG/ML IJ SOLN
INTRAMUSCULAR | Status: AC
  Filled 2020-08-02: qty 1

## 2020-08-02 MED ORDER — OCTREOTIDE ACETATE 30 MG IM KIT
PACK | INTRAMUSCULAR | Status: AC
  Filled 2020-08-02: qty 1

## 2020-08-02 MED ORDER — SODIUM CHLORIDE 0.9 % IV SOLN: 500.0000 mL | Freq: Once | INTRAVENOUS | Status: DC

## 2020-08-02 MED ORDER — SODIUM CHLORIDE 0.9 % IV SOLN
8.0000 mg | Freq: Once | INTRAVENOUS | Status: AC
  Administered 2020-08-02: 8 mg via INTRAVENOUS
  Filled 2020-08-02: qty 4

## 2020-08-02 MED ORDER — PROCHLORPERAZINE EDISYLATE 10 MG/2ML IJ SOLN
INTRAMUSCULAR | Status: AC
  Administered 2020-08-02: 10 mg via INTRAVENOUS
  Filled 2020-08-02: qty 2

## 2020-08-02 MED ORDER — OCTREOTIDE ACETATE 30 MG IM KIT
PACK | INTRAMUSCULAR | Status: AC
  Administered 2020-08-02: 60 mg via INTRAMUSCULAR
  Filled 2020-08-02: qty 1

## 2020-08-02 MED ORDER — PROCHLORPERAZINE EDISYLATE 10 MG/2ML IJ SOLN: 10.0000 mg | Freq: Four times a day (QID) | INTRAMUSCULAR | Status: DC | PRN

## 2020-08-02 MED ORDER — OCTREOTIDE ACETATE 500 MCG/ML IJ SOLN: 500.0000 ug | Freq: Once | INTRAMUSCULAR | Status: DC | PRN

## 2020-08-02 MED ORDER — LUTETIUM LU 177 DOTATATE 370 MBQ/ML IV SOLN
200.0000 | Freq: Once | INTRAVENOUS | Status: AC
  Administered 2020-08-02: 199 via INTRAVENOUS

## 2020-08-02 MED ORDER — AMINO ACID RADIOPROTECTANT - L-LYSINE 2.5%/L-ARGININE 2.5% IN NS
250.0000 mL/h | INTRAVENOUS | Status: AC
  Administered 2020-08-02: 250 mL/h via INTRAVENOUS
  Filled 2020-08-02: qty 1000

## 2020-08-02 MED ORDER — OCTREOTIDE ACETATE 30 MG IM KIT: 60.0000 mg | PACK | Freq: Once | INTRAMUSCULAR | Status: AC

## 2020-08-02 MED ORDER — OCTREOTIDE ACETATE 30 MG IM KIT: 60.0000 mg | PACK | Freq: Once | INTRAMUSCULAR | Status: DC

## 2020-08-02 NOTE — Progress Notes (Signed)
CLINICAL DATA: [age]] year-old [male] with metastatic neuroendocrine tumor. Well differentiated tumor with somatostatin receptor is identified within the [liver and mesentery] by DOTATATE PET CT scan. EXAM: NUCLEAR MEDICINE LUTATHERA ADMINISTRATION TECHNIQUE: Infusion: The nuclear medicine technologist and I personally verified the dose activity ([201] mCi) to be delivered as specified in the written directive (200 mCi), and verified the patient identification via 2 separate methods. 20 gauge IV were started in the antecubital veins. Anti-emetics were administered by nursing staff. Amino acid renal protection was initiated 30 minutes prior to Lu 177 DOTATATE (Lutathera) infusion and continued continuously for 4 hours. Lutathera infusion was administered over 30 minutes.   The total administered dose was [199] mCi Lu 177 DOTATATE.  The entire IV tubing, venocatheter, stopcock and syringes was removed in total, placed in a disposal bag and sent for assay of the residual activity, which will be reported at a later time in our EMR by the physics staff. Pressure was applied to the venipuncture sites, and a compression bandage placed. Radiation Safety personnel were present to perform the discharge survey, as detailed on their documentation.  Patient received 30 mg IM long-acting Sandostatin injection 4 hours after Lutathera effusion in the nuclear medicine department.  RADIOPHARMACEUTICALS:  [One hundred ninety-nine] mCi Lu 177 DOTATATE  FINDINGS: Diagnosis: [Metastatic neuroendocrine tumor.]  Current Infusion: [Second]  Planned Infusions: [4]  Patient reports [minimal] interval symptoms following therapy.  Patient did report nausea vomiting during the last treatment which was recorded.  Patient was given Compazine prophylactically for nausea vomiting as well as 1 mg Ativan for procedure anxiety.  The patient's most recent blood counts were reviewed and remains a good candidate to  proceed with Lutathera.  Of note, the patient's white blood cell count was mildly depressed at 3.3 and platelets mildly depressed at 128 K.   Patient was advised to be aware of any bruising or infection following therapy along with standard precautions, risk and benefits..   The patient was situated in an infusion suite and administered Lutathera as above.  Patient tolerated the effusion well with no nausea, vomiting or diarrhea   Patient will follow-up with referring oncologist for interval serum laboratories (CBC and CMP) in approximately 4 weeks.    Patient received 60 mg IM long-acting Sandostatin injection 4 hours after Lutathera effusion in the nuclear medicine department.   IMPRESSION: [Second] VQ 945 [id number] treatment for metastatic neuroendocrine tumor. The patient tolerated the infusion well. The patient will return in 4 weeks for Sandostatin injection and labs.

## 2020-08-22 ENCOUNTER — Ambulatory Visit (HOSPITAL_COMMUNITY): Payer: BC Managed Care – PPO

## 2020-08-29 ENCOUNTER — Other Ambulatory Visit: Payer: Self-pay

## 2020-08-29 ENCOUNTER — Ambulatory Visit (HOSPITAL_COMMUNITY)
Admission: RE | Admit: 2020-08-29 | Discharge: 2020-08-29 | Disposition: A | Discharge status: Home / Self Care | Payer: BC Managed Care – PPO | Source: Ambulatory Visit | Attending: Diagnostic Radiology | Admitting: Diagnostic Radiology

## 2020-08-29 DIAGNOSIS — D3A8 Other benign neuroendocrine tumors: Secondary | ICD-10-CM

## 2020-08-29 LAB — CBC WITH DIFFERENTIAL/PLATELET
Abs Immature Granulocytes: 0.01 10*3/uL (ref 0.00–0.07)
Basophils Absolute: 0 10*3/uL (ref 0.0–0.1)
Basophils Relative: 1 %
Eosinophils Absolute: 0.1 10*3/uL (ref 0.0–0.5)
Eosinophils Relative: 2 %
HCT: 39.3 % (ref 39.0–52.0)
Hemoglobin: 14.1 g/dL (ref 13.0–17.0)
Immature Granulocytes: 0 %
Lymphocytes Relative: 25 %
Lymphs Abs: 0.8 10*3/uL (ref 0.7–4.0)
MCH: 33.7 pg (ref 26.0–34.0)
MCHC: 35.9 g/dL (ref 30.0–36.0)
MCV: 94 fL (ref 80.0–100.0)
Monocytes Absolute: 0.6 10*3/uL (ref 0.1–1.0)
Monocytes Relative: 18 %
Neutro Abs: 1.8 10*3/uL (ref 1.7–7.7)
Neutrophils Relative %: 54 %
Platelets: 125 10*3/uL — ABNORMAL LOW (ref 150–400)
RBC: 4.18 MIL/uL — ABNORMAL LOW (ref 4.22–5.81)
RDW: 13 % (ref 11.5–15.5)
WBC: 3.4 10*3/uL — ABNORMAL LOW (ref 4.0–10.5)
nRBC: 0 % (ref 0.0–0.2)

## 2020-08-29 LAB — COMPREHENSIVE METABOLIC PANEL
ALT: 46 U/L — ABNORMAL HIGH (ref 0–44)
AST: 37 U/L (ref 15–41)
Albumin: 3.9 g/dL (ref 3.5–5.0)
Alkaline Phosphatase: 68 U/L (ref 38–126)
Anion gap: 7 (ref 5–15)
BUN: 15 mg/dL (ref 6–20)
CO2: 24 mmol/L (ref 22–32)
Calcium: 8.7 mg/dL — ABNORMAL LOW (ref 8.9–10.3)
Chloride: 105 mmol/L (ref 98–111)
Creatinine, Ser: 0.91 mg/dL (ref 0.61–1.24)
GFR, Estimated: >60 mL/min (ref >60)
Glucose, Bld: 169 mg/dL — ABNORMAL HIGH (ref 70–99)
Potassium: 4 mmol/L (ref 3.5–5.1)
Sodium: 136 mmol/L (ref 135–145)
Total Bilirubin: 0.9 mg/dL (ref 0.3–1.2)
Total Protein: 7 g/dL (ref 6.5–8.1)

## 2020-08-29 MED ORDER — OCTREOTIDE ACETATE 30 MG IM KIT
PACK | INTRAMUSCULAR | Status: AC
  Administered 2020-08-29: 60 mg via INTRAMUSCULAR
  Filled 2020-08-29: qty 2

## 2020-08-29 MED ORDER — OCTREOTIDE ACETATE 30 MG IM KIT: 60.0000 mg | PACK | Freq: Once | INTRAMUSCULAR | Status: AC

## 2020-08-29 NOTE — Progress Notes (Signed)
Component     Latest Ref Rng & Units 05/30/2020 08/02/2020 08/29/2020  WBC     4.0 - 10.5 K/uL 4.7 3.3 (L) 3.4 (L)  RBC     4.22 - 5.81 MIL/uL 4.48 4.17 (L) 4.18 (L)  Hemoglobin     13.0 - 17.0 g/dL 14.9 14.0 14.1  HCT     39 - 52 % 41.8 39.3 39.3  MCV     80.0 - 100.0 fL 93.3 94.2 94.0  MCH     26.0 - 34.0 pg 33.3 33.6 33.7  MCHC     30.0 - 36.0 g/dL 35.6 35.6 35.9  RDW     11.5 - 15.5 % 13.2 13.4 13.0  Platelets     150 - 400 K/uL 189 128 (L) 125 (L)  nRBC     0.0 - 0.2 % 0.0 0.0 0.0  Neutrophils     % 54 56 54  NEUT#     1.7 - 7.7 K/uL 2.6 1.8 1.8  Lymphocytes     % 32 25 25  Lymphocyte #     0.7 - 4.0 K/uL 1.5 0.8 0.8  Monocytes Relative     % 10 16 18   Monocyte #     0.1 - 1.0 K/uL 0.5 0.5 0.6  Eosinophil     % 3 2 2   Eosinophils Absolute     0.0 - 0.5 K/uL 0.1 0.1 0.1  Basophil     % 1 1 1   Basophils Absolute     0.0 - 0.1 K/uL 0.0 0.0 0.0  Immature Granulocytes     % 0 0 0  Abs Immature Granulocytes     0.00 - 0.07 K/uL 0.01 0.01 0.01  Patient presents 1 month following second Lutathera treatment.  Patient has no new complaints and did well following the second peptide receptor radiotherapy therapy.    Patient labs were drawn and evaluated.  White blood count and platelets remain slightly depressed but no change from 1 month prior.  Renal function and liver function normal.  Chromogranin A  and Sandostatin pending   Patient received 60 mg IM Sandostatin.   Patient will return in 1 month time for third Lu- [place] (Lutathera)

## 2020-08-31 LAB — CHROMOGRANIN A: Chromogranin A (ng/mL): 131 ng/mL — ABNORMAL HIGH (ref 0.0–101.8)

## 2020-09-01 LAB — SEROTONIN SERUM: Serotonin, Serum: 194 ng/mL (ref 21–321)

## 2020-09-19 ENCOUNTER — Ambulatory Visit (HOSPITAL_COMMUNITY): Payer: BC Managed Care – PPO

## 2020-09-27 ENCOUNTER — Ambulatory Visit (HOSPITAL_COMMUNITY)
Admission: RE | Admit: 2020-09-27 | Discharge: 2020-09-27 | Disposition: A | Discharge status: Home / Self Care | Payer: BC Managed Care – PPO | Source: Ambulatory Visit | Attending: Hematology and Oncology | Admitting: Hematology and Oncology

## 2020-09-27 ENCOUNTER — Other Ambulatory Visit: Payer: Self-pay

## 2020-09-27 DIAGNOSIS — D3A8 Other benign neuroendocrine tumors: Secondary | ICD-10-CM | POA: Insufficient documentation

## 2020-09-27 LAB — COMPREHENSIVE METABOLIC PANEL
ALT: 57 U/L — ABNORMAL HIGH (ref 0–44)
AST: 45 U/L — ABNORMAL HIGH (ref 15–41)
Albumin: 3.9 g/dL (ref 3.5–5.0)
Alkaline Phosphatase: 62 U/L (ref 38–126)
Anion gap: 9 (ref 5–15)
BUN: 14 mg/dL (ref 6–20)
CO2: 25 mmol/L (ref 22–32)
Calcium: 8.5 mg/dL — ABNORMAL LOW (ref 8.9–10.3)
Chloride: 103 mmol/L (ref 98–111)
Creatinine, Ser: 0.87 mg/dL (ref 0.61–1.24)
GFR, Estimated: >60 mL/min (ref >60)
Glucose, Bld: 209 mg/dL — ABNORMAL HIGH (ref 70–99)
Potassium: 3.8 mmol/L (ref 3.5–5.1)
Sodium: 137 mmol/L (ref 135–145)
Total Bilirubin: 0.8 mg/dL (ref 0.3–1.2)
Total Protein: 6.9 g/dL (ref 6.5–8.1)

## 2020-09-27 LAB — CBC WITH DIFFERENTIAL/PLATELET
Abs Immature Granulocytes: 0.01 10*3/uL (ref 0.00–0.07)
Basophils Absolute: 0 10*3/uL (ref 0.0–0.1)
Basophils Relative: 0 %
Eosinophils Absolute: 0.1 10*3/uL (ref 0.0–0.5)
Eosinophils Relative: 2 %
HCT: 37 % — ABNORMAL LOW (ref 39.0–52.0)
Hemoglobin: 13.5 g/dL (ref 13.0–17.0)
Immature Granulocytes: 0 %
Lymphocytes Relative: 24 %
Lymphs Abs: 0.8 10*3/uL (ref 0.7–4.0)
MCH: 34.7 pg — ABNORMAL HIGH (ref 26.0–34.0)
MCHC: 36.5 g/dL — ABNORMAL HIGH (ref 30.0–36.0)
MCV: 95.1 fL (ref 80.0–100.0)
Monocytes Absolute: 0.4 10*3/uL (ref 0.1–1.0)
Monocytes Relative: 14 %
Neutro Abs: 1.9 10*3/uL (ref 1.7–7.7)
Neutrophils Relative %: 60 %
Platelets: 130 10*3/uL — ABNORMAL LOW (ref 150–400)
RBC: 3.89 MIL/uL — ABNORMAL LOW (ref 4.22–5.81)
RDW: 13.3 % (ref 11.5–15.5)
WBC: 3.2 10*3/uL — ABNORMAL LOW (ref 4.0–10.5)
nRBC: 0 % (ref 0.0–0.2)

## 2020-09-27 MED ORDER — PROCHLORPERAZINE EDISYLATE 10 MG/2ML IJ SOLN
10.0000 mg | Freq: Four times a day (QID) | INTRAMUSCULAR | Status: DC | PRN
  Administered 2020-09-27: 10 mg via INTRAVENOUS

## 2020-09-27 MED ORDER — SODIUM CHLORIDE 0.9 % IV SOLN
8.0000 mg | Freq: Once | INTRAVENOUS | Status: AC
  Administered 2020-09-27: 8 mg via INTRAVENOUS
  Filled 2020-09-27: qty 4

## 2020-09-27 MED ORDER — AMINO ACID RADIOPROTECTANT - L-LYSINE 2.5%/L-ARGININE 2.5% IN NS
250.0000 mL/h | INTRAVENOUS | Status: AC
  Administered 2020-09-27: 250 mL/h via INTRAVENOUS
  Filled 2020-09-27: qty 1000

## 2020-09-27 MED ORDER — OCTREOTIDE ACETATE 500 MCG/ML IJ SOLN: 500.0000 ug | Freq: Once | INTRAMUSCULAR | Status: DC | PRN

## 2020-09-27 MED ORDER — ONDANSETRON HCL 8 MG PO TABS: 8.0000 mg | ORAL_TABLET | Freq: Two times a day (BID) | ORAL | 0 refills | Status: DC | PRN

## 2020-09-27 MED ORDER — SODIUM CHLORIDE 0.9 % IV SOLN: 500.0000 mL | Freq: Once | INTRAVENOUS | Status: DC

## 2020-09-27 MED ORDER — OCTREOTIDE ACETATE 30 MG IM KIT
PACK | INTRAMUSCULAR | Status: AC
  Administered 2020-09-27: 60 mg via INTRAMUSCULAR
  Filled 2020-09-27: qty 2

## 2020-09-27 MED ORDER — LUTETIUM LU 177 DOTATATE 370 MBQ/ML IV SOLN
200.0000 | Freq: Once | INTRAVENOUS | Status: AC
  Administered 2020-09-27: 199 via INTRAVENOUS

## 2020-09-27 MED ORDER — OCTREOTIDE ACETATE 500 MCG/ML IJ SOLN
INTRAMUSCULAR | Status: AC
  Filled 2020-09-27: qty 1

## 2020-09-27 MED ORDER — PROCHLORPERAZINE EDISYLATE 10 MG/2ML IJ SOLN
INTRAMUSCULAR | Status: AC
  Filled 2020-09-27: qty 2

## 2020-09-27 MED ORDER — LORAZEPAM 2 MG/ML IJ SOLN
1.0000 mg | Freq: Once | INTRAMUSCULAR | Status: AC
  Administered 2020-09-27: 1 mg via INTRAVENOUS
  Filled 2020-09-27: qty 0.5

## 2020-09-27 MED ORDER — OCTREOTIDE ACETATE 30 MG IM KIT: 60.0000 mg | PACK | Freq: Once | INTRAMUSCULAR | Status: AC

## 2020-09-27 NOTE — Progress Notes (Signed)
CLINICAL DATA: [age]] year-old [male] with metastatic neuroendocrine tumor. Well differentiated tumor with somatostatin receptor is identified within the [liver and mesentery] by DOTATATE PET CT scan.  Ongoing peptide receptor radiotherapy.  EXAM: NUCLEAR MEDICINE LUTATHERA ADMINISTRATION TECHNIQUE: Infusion: The nuclear medicine technologist and I personally verified the dose activity ([201] mCi) to be delivered as specified in the written directive (200 mCi), and verified the patient identification via 2 separate methods. 20 gauge IV were started in the antecubital veins. Anti-emetics were administered by nursing staff. Amino acid renal protection was initiated 30 minutes prior to Lu 177 DOTATATE (Lutathera) infusion and continued continuously for 4 hours. Lutathera infusion was administered over 30 minutes.   The total administered dose was [199] mCi Lu 177 DOTATATE.  The entire IV tubing, venocatheter, stopcock and syringes was removed in total, placed in a disposal bag and sent for assay of the residual activity, which will be reported at a later time in our EMR by the physics staff. Pressure was applied to the venipuncture sites, and a compression bandage placed. Radiation Safety personnel were present to perform the discharge survey, as detailed on their documentation.  Patient received 60 mg IM long-acting Sandostatin injection 4 hours after Lutathera effusion in the nuclear medicine department.  RADIOPHARMACEUTICALS:  [One hundred ninety-nine] mCi Lu 177 DOTATATE  FINDINGS: Diagnosis: [Metastatic neuroendocrine tumor.]  Current Infusion: [3]  Planned Infusions: [4]  Patient reports [minimal] interval symptoms following therapy. The patient's most recent blood counts were reviewed and remains a good candidate to proceed with Lutathera.  Patient's white blood cell count and platelets remain mildly depressed but not changed from 2 month prior (white blood cell count equals  3.3K  and platelets equal 128 K).  Counts mildly depressed from pre therapy values (white blood cell count equal 4.7 platelets equal 189 on 05/30/2020)   As previously patient was given Ativan 1 mg for anxiety and 10 mg Compazine for nausea prevention which occurred during the first treatment.    The patient was situated in an infusion suite and administered Lutathera as above. Patient will follow-up with referring oncologist for interval serum laboratories (CBC and CMP) in approximately 4 weeks.    Patient received 30 mg IM long-acting Sandostatin injection 4 hours after Lutathera effusion in the nuclear medicine department.   IMPRESSION: [Third] VX 793 [id number] treatment for metastatic neuroendocrine tumor. The patient tolerated the infusion well. The patient will return in 4 weeks for Sandostatin injection and laboratory evaluation.

## 2020-09-27 NOTE — Progress Notes (Signed)
The patient has received dose without complications or distress. The appropriate staff was present. Patient is alert and oriented to baseline for discharge. Vitals are stable.

## 2020-09-27 NOTE — Progress Notes (Signed)
Upon entering the room for his 3rd Lutathera treatment, the patient expresses his anxiety stating "I need that medication I had last time, today I am very anxious which is not my norm and I did not sleep well at all last night behind this. The patient denies any pain, nausea or vomiting at this time. He request 0.5 ALPRAZolam (XANAX) 0.5 MG tablet. Dr. Lesia Sago has been notified. Vitals are stable.

## 2020-09-27 NOTE — Progress Notes (Signed)
The patient tolerated pre medications without difficulty and rested comfortably through the lutathera portion. No nausea or vomiting. Will continue to monitor.

## 2020-10-11 ENCOUNTER — Ambulatory Visit (HOSPITAL_COMMUNITY): Admission: RE | Admit: 2020-10-11 | Payer: BC Managed Care – PPO | Source: Ambulatory Visit

## 2020-11-14 ENCOUNTER — Ambulatory Visit (HOSPITAL_COMMUNITY): Payer: BC Managed Care – PPO

## 2020-11-22 ENCOUNTER — Other Ambulatory Visit: Payer: Self-pay

## 2020-11-22 ENCOUNTER — Ambulatory Visit (HOSPITAL_COMMUNITY)
Admission: RE | Admit: 2020-11-22 | Discharge: 2020-11-22 | Disposition: A | Discharge status: Home / Self Care | Payer: BC Managed Care – PPO | Source: Ambulatory Visit | Attending: Hematology and Oncology | Admitting: Hematology and Oncology

## 2020-11-22 DIAGNOSIS — D3A8 Other benign neuroendocrine tumors: Secondary | ICD-10-CM | POA: Insufficient documentation

## 2020-11-22 LAB — CBC WITH DIFFERENTIAL/PLATELET
Abs Immature Granulocytes: 0.01 10*3/uL (ref 0.00–0.07)
Basophils Absolute: 0 10*3/uL (ref 0.0–0.1)
Basophils Relative: 0 %
Eosinophils Absolute: 0 10*3/uL (ref 0.0–0.5)
Eosinophils Relative: 1 %
HCT: 37.1 % — ABNORMAL LOW (ref 39.0–52.0)
Hemoglobin: 13.3 g/dL (ref 13.0–17.0)
Immature Granulocytes: 0 %
Lymphocytes Relative: 27 %
Lymphs Abs: 0.6 10*3/uL — ABNORMAL LOW (ref 0.7–4.0)
MCH: 34.7 pg — ABNORMAL HIGH (ref 26.0–34.0)
MCHC: 35.8 g/dL (ref 30.0–36.0)
MCV: 96.9 fL (ref 80.0–100.0)
Monocytes Absolute: 0.5 10*3/uL (ref 0.1–1.0)
Monocytes Relative: 19 %
Neutro Abs: 1.3 10*3/uL — ABNORMAL LOW (ref 1.7–7.7)
Neutrophils Relative %: 53 %
Platelets: 115 10*3/uL — ABNORMAL LOW (ref 150–400)
RBC: 3.83 MIL/uL — ABNORMAL LOW (ref 4.22–5.81)
RDW: 13.5 % (ref 11.5–15.5)
WBC: 2.4 10*3/uL — ABNORMAL LOW (ref 4.0–10.5)
nRBC: 0 % (ref 0.0–0.2)

## 2020-11-22 LAB — COMPREHENSIVE METABOLIC PANEL
ALT: 74 U/L — ABNORMAL HIGH (ref 0–44)
AST: 70 U/L — ABNORMAL HIGH (ref 15–41)
Albumin: 3.7 g/dL (ref 3.5–5.0)
Alkaline Phosphatase: 61 U/L (ref 38–126)
Anion gap: 10 (ref 5–15)
BUN: 19 mg/dL (ref 6–20)
CO2: 23 mmol/L (ref 22–32)
Calcium: 8.7 mg/dL — ABNORMAL LOW (ref 8.9–10.3)
Chloride: 101 mmol/L (ref 98–111)
Creatinine, Ser: 0.93 mg/dL (ref 0.61–1.24)
GFR, Estimated: >60 mL/min (ref >60)
Glucose, Bld: 289 mg/dL — ABNORMAL HIGH (ref 70–99)
Potassium: 4.2 mmol/L (ref 3.5–5.1)
Sodium: 134 mmol/L — ABNORMAL LOW (ref 135–145)
Total Bilirubin: 1.2 mg/dL (ref 0.3–1.2)
Total Protein: 6.9 g/dL (ref 6.5–8.1)

## 2020-11-22 MED ORDER — OCTREOTIDE ACETATE 30 MG IM KIT
60.0000 mg | PACK | Freq: Once | INTRAMUSCULAR | Status: AC
  Administered 2020-11-22: 60 mg via INTRAMUSCULAR

## 2020-11-22 MED ORDER — SODIUM CHLORIDE 0.9 % IV SOLN
8.0000 mg | Freq: Once | INTRAVENOUS | Status: AC
  Administered 2020-11-22: 8 mg via INTRAVENOUS
  Filled 2020-11-22: qty 4

## 2020-11-22 MED ORDER — AMINO ACID RADIOPROTECTANT - L-LYSINE 2.5%/L-ARGININE 2.5% IN NS
250.0000 mL/h | INTRAVENOUS | Status: AC
  Administered 2020-11-22: 250 mL/h via INTRAVENOUS
  Filled 2020-11-22: qty 1000

## 2020-11-22 MED ORDER — LUTETIUM LU 177 DOTATATE 370 MBQ/ML IV SOLN
200.0000 | Freq: Once | INTRAVENOUS | Status: AC
  Administered 2020-11-22: 200 via INTRAVENOUS

## 2020-11-22 MED ORDER — OCTREOTIDE ACETATE 30 MG IM KIT
PACK | INTRAMUSCULAR | Status: AC
  Filled 2020-11-22: qty 2

## 2020-11-22 MED ORDER — PROCHLORPERAZINE EDISYLATE 10 MG/2ML IJ SOLN: 10.0000 mg | Freq: Four times a day (QID) | INTRAMUSCULAR | Status: DC | PRN

## 2020-11-22 MED ORDER — LORAZEPAM 2 MG/ML IJ SOLN
1.0000 mg | Freq: Once | INTRAMUSCULAR | Status: AC
  Administered 2020-11-22: 1 mg via INTRAVENOUS
  Filled 2020-11-22 (×2): qty 0.5

## 2020-11-22 MED ORDER — ONDANSETRON HCL 8 MG PO TABS: 8.0000 mg | ORAL_TABLET | Freq: Two times a day (BID) | ORAL | 0 refills | Status: AC | PRN

## 2020-11-22 MED ORDER — SODIUM CHLORIDE 0.9 % IV SOLN: 500.0000 mL | Freq: Once | INTRAVENOUS | Status: DC

## 2020-11-22 MED ORDER — OCTREOTIDE ACETATE 500 MCG/ML IJ SOLN: 500.0000 ug | Freq: Once | INTRAMUSCULAR | Status: DC | PRN

## 2020-11-22 NOTE — Progress Notes (Signed)
Lunch tray ordered for patient.

## 2020-11-22 NOTE — Progress Notes (Signed)
Completed amino acid, waiting for sandostatin injection.  Tolerated Lutathera well.

## 2020-11-22 NOTE — Progress Notes (Signed)
Slight redness noted at IV site on left arm.  Informed Dr. Leonia Reeves,  He states that he has seen this before and it has to with the [facility] of the amino.  Pt states no pain or burning at site.  NM tech Leah rechecked right arm , flushed well no pain.  Per Dr. Leonia Reeves patient will receive lutathera in right arm

## 2020-11-22 NOTE — Progress Notes (Signed)
Rash noted at infusing site.  Alex Adams's stoppped Dr. Leonia Reeves notified.  Rash disipated with in a few minutes of IV stopping.  Per Dr. Leonia Reeves instruction restarted Aminos in left arm at 200/hr  No rash noted.  Will continue to monitor

## 2020-11-22 NOTE — Progress Notes (Signed)
Per patient request, spoke with Dr. Leonia Reeves related to pt wanting anti anxiety medication he received during his last treatment,  Order received from Dr. Leonia Reeves

## 2020-11-22 NOTE — Progress Notes (Signed)
CLINICAL DATA: [age]] year-old [male] with metastatic neuroendocrine tumor. Well differentiated tumor with somatostatin receptor is identified within the [liver and mesentery] by DOTATATE PET CT scan. EXAM: NUCLEAR MEDICINE LUTATHERA ADMINISTRATION TECHNIQUE: Infusion: The nuclear medicine technologist and I personally verified the dose activity 202 mCi) to be delivered as specified in the written directive (200 mCi), and verified the patient identification via 2 separate methods. 20 gauge IV were started in the antecubital veins. Anti-emetics were administered by nursing staff. Amino acid renal protection was initiated 30 minutes prior to Lu 177 DOTATATE (Lutathera) infusion and continued continuously for 4 hours. Lutathera infusion was administered over 30 minutes.   The total administered dose was [199.5] mCi Lu 177 DOTATATE.  The entire IV tubing, venocatheter, stopcock and syringes was removed in total, placed in a disposal bag and sent for assay of the residual activity, which will be reported at a later time in our EMR by the physics staff. Pressure was applied to the venipuncture sites, and a compression bandage placed. Radiation Safety personnel were present to perform the discharge survey, as detailed on their documentation.  Patient received 60 mg IM long-acting Sandostatin injection 4 hours after Lutathera effusion in the nuclear medicine department.  RADIOPHARMACEUTICALS:  [199.5] mCi Lu 177 DOTATATE  FINDINGS: Diagnosis: [Metastatic neuroendocrine tumor.]  Current Infusion: [Fourth]  Planned Infusions: [4]  Patient reports [minimal] interval symptoms following therapy. The patient's most recent blood counts were reviewed and remains a good candidate to proceed with Lutathera.    Patient's white blood cell count has declined since initiation of therapy and is now 2.4 K. This is higher than exclusion criteria for proceeding with Lutathera treatment with no dose  reduction felt necessary.   Favor leukopenia to be  transitory and potentially related to Lu - 177 therapy.  Anticipate white count to recover over the next several months.    The patient was situated in an infusion suite and administered Lutathera as above. Patient will follow-up with referring oncologist for interval serum laboratories (CBC and CMP) in approximately 4 weeks.    Patient received 60 mg IM long-acting Sandostatin injection 4 hours after Lutathera effusion in the nuclear medicine department.   IMPRESSION: [Fourth and final] FY 101 [id number] treatment for metastatic neuroendocrine tumor. The patient tolerated the infusion well.  Patient well return in 6 to 8 weeks for  DOTATATE PET scan to evaluate treatment response.   Patient will resume Sandostatin injections with oncologist Dr. Wendee Beavers in 4 weeks.   Recommend monitoring white blood cell count as above.

## 2021-01-24 ENCOUNTER — Ambulatory Visit (HOSPITAL_COMMUNITY)
Admission: RE | Admit: 2021-01-24 | Discharge: 2021-01-24 | Disposition: A | Discharge status: Home / Self Care | Payer: BC Managed Care – PPO | Source: Ambulatory Visit | Attending: Diagnostic Radiology | Admitting: Diagnostic Radiology

## 2021-01-24 ENCOUNTER — Other Ambulatory Visit: Payer: Self-pay

## 2021-01-24 DIAGNOSIS — D3A8 Other benign neuroendocrine tumors: Secondary | ICD-10-CM | POA: Insufficient documentation

## 2021-01-24 NOTE — Consult Note (Signed)
Chief Complaint: Patient with metastatic neuroendocrine tumor post completion of 4 cycles Peptide receptor radiotherapy (PRRT) with [id number] DOTATATE (Lutathera).  Referring Physician(s):Chinnasami   Patient Status: [facility] - Out-pt  History of Present Illness: Alex Adams is a 60 y.o. 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  Patient status post yttrium 90 radio embolization of LEFT RIGHT hepatic lobe November 2015. Patient currently receiving monthly Sandostatin injections of 60 mg.  DOTATATE PET-CT imaging demonstrate mild progression of hepatic metastasis.  Patient has recently Completed 4 cycles of peptide receptor radiotherapy.  Final infusion date 11/22/2020.  Patient received 4 doses of 671 millicurie Lu  245 Dotatate without event.    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 Authorizing Provider  ondansetron (ZOFRAN) 8 MG tablet Take 1 tablet (8 mg total) by mouth 2 (two) times daily as needed for nausea or vomiting. 11/22/20   Gus Height, MD  sildenafil (REVATIO) 20 MG tablet 2-5 tabs po 1-2 hrs before sex prn 02/20/16   [provider]     No family history on file.  Social History   Socioeconomic History   Marital status: Married    Spouse name: Not on file   Number of children: Not on file   Years of education: Not on file   Highest education level: Not on file  Occupational History   Not on file  Tobacco Use   Smoking status: Never Smoker   Smokeless tobacco: Never Used  Substance and Sexual Activity   Alcohol use: No   Drug use: No   Sexual activity: Not on file  Other Topics Concern   Not on file  Social History Narrative   Not  on file   Social Determinants of Health   Financial Resource Strain: Not on file  Food Insecurity: Not on file  Transportation Needs: Not on file  Physical Activity: Not on file  Stress: Not on file  Social Connections: Not on file    ECOG Status: 0 - Asymptomatic  Review of Systems: A 12 point ROS discussed and pertinent positives are indicated in the HPI above.  All other systems are negative.  Review of Systems  Vital Signs: There were no vitals taken for this visit.  Physical Exam deffered Imaging: No results found.  Labs:  CBC: Recent Labs    08/02/20 0810 08/29/20 1032 09/27/20 0838 11/22/20 0836  WBC 3.3* 3.4* 3.2* 2.4*  HGB 14.0 14.1 13.5 13.3  HCT 39.3 39.3 37.0* 37.1*  PLT 128* 125* 130* 115*   Labs 12/20/20  WBC = 3.2, Hgb - 13.3, PLT = 110K  Bil = 0.9, AST 103, ALT 121  COAGS: No results for input(s): INR, APTT in the last 8760 hours.  BMP: Recent Labs    05/30/20 0819 08/02/20 0810 08/29/20 1032 09/27/20 0838 11/22/20 0836  NA 135 136 136 137 134*  K 4.1 4.1 4.0 3.8 4.2  CL 103 102 105 103 101  CO2 23 23 24 25 23   GLUCOSE 163* 179* 169* 209* 289*  BUN 12 14 15 14 19   CALCIUM 8.8* 8.9 8.7* 8.5* 8.7*  CREATININE 0.87 0.87 0.91 0.87 0.93  GFRNONAA >60 >60 >60 >60 >60  GFRAA >  60  --   --   --   --     LIVER FUNCTION TESTS: Recent Labs    08/02/20 0810 08/29/20 1032 09/27/20 0838 11/22/20 0836  BILITOT 0.9 0.9 0.8 1.2  AST 42* 37 45* 70*  ALT 51* 46* 57* 74*  ALKPHOS 63 68 62 61  PROT 7.2 7.0 6.9 6.9  ALBUMIN 3.9 3.9 3.9 3.7    TUMOR MARKERS: No results for input(s): AFPTM, CEA, CA199, CHROMOGRNA in the last 8760 hours.  Assessment and Plan: Patient status post peptide receptor radiotherapy.  Positive response on  DOTATATE PET scan (01/24/2021) with reduction size and radiotracer activity hepatic metastasis.  Mesenteric metastatic disease is stable.  No evidence disease progression.   Measurable positive response to peptide  receptor radiotherapy has been shown to continue for months following cessation of the initial treatment.  Patient tolerated procedure well.  No evidence of significant marrow suppression, renal toxicity or liver toxicity.  Most recent labs 12/22/2020  demonstrated improved white blood cell counts and decreased bilirubin.    Thank you for this interesting consult.  I greatly enjoyed meeting Alex Adams and look forward to participating in their care.  A copy of this report was sent to the requesting provider on this date.  Electronically Signed: Rennis Golden, MD 01/24/2021, 2:28 PM   I spent a total of    10 Minutes in face to face in clinical consultation, greater than 50% of which was counseling/coordinating care for metastatic neuroendocrine tumor.

## 2021-05-28 IMAGING — NM NM [PERSON_NAME] ADMINISTRATION
2 series · 2 of 2 positions shown · non-contrast
Comparison: none

CLINICAL DATA: [age] year-old male with metastatic
neuroendocrine tumor. Well differentiated tumor with somatostatin
receptor is identified within the liver by DOTATATE PET CT scan.

EXAM:
NUCLEAR MEDICINE LUTATHERA ADMINISTRATION
TECHNIQUE: Infusion: The nuclear medicine technologist and I personally
verified the dose activity (203 mCi) to be delivered as specified in
the written directive (200 mCi), and verified the patient
identification via 2 separate methods. 20 gauge IV were started in
the antecubital veins. Anti-emetics were administered by nursing
staff. Jin-Woo acid renal protection was initiated 30 minutes prior to
Lu 177 DOTATATE (Lutathera) infusion and continued continuously for
4 hours. Lutathera infusion was administered over 30 minutes.

[Series 1: static · 2.07mm/px · 1 of 1 slices shown (1 of 2)]
[im 1/1]
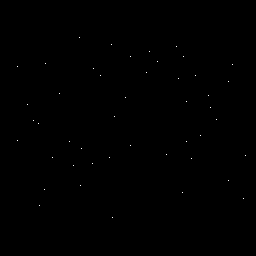

[Series 1: static · 2.07mm/px · 1 of 1 slices shown (2 of 2)]
[im 1/1]
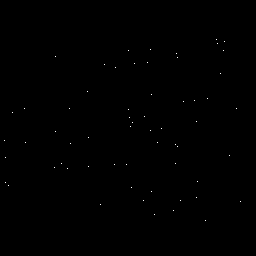

[2 of 2 positions shown; findings below may reference images not displayed]

The total administered dose was 201 mCi Lu 177 DOTATATE.

The entire IV tubing, venocatheter, stopcock and syringes was
removed in total, placed in a disposal bag and sent for assay of the
residual activity, which will be reported at a later time in our EMR
by the physics staff. Pressure was applied to the venipuncture
sites, and a compression bandage placed. Radiation Safety personnel
were present to perform the discharge survey, as detailed on their
documentation.

Patient received 60 mg IM long-acting Sandostatin injection 4 hours
after Lutathera effusion in the nuclear medicine department.

RADIOPHARMACEUTICALS:  201 mCi Lu 177 DOTATATE
FINDINGS: Diagnosis: Metastatic neuroendocrine tumor.

Current Infusion: 1

Planned Infusions: 4

Patient reports persistent diarrhea. Otherwise no symptomology. The
patient's most recent blood counts were reviewed and remains a good
candidate to proceed with Lutathera. The patient was situated in an
infusion suite and administered Lutathera as above. The patient did
experience mild-to-moderate nausea without vomiting. Several
episodes of diarrhea.

Patient was administered additional dose of Compazine for nausea.

Patient will follow-up with referring oncologist for interval serum
laboratories (CBC and CMP) in approximately 4 weeks.

Patient received 60 mg IM long-acting Sandostatin injection 4 hours
after Lutathera effusion in the nuclear medicine department.
IMPRESSION: First Lu 177 DOTATATE treatment for metastatic neuroendocrine tumor.
The patient tolerated the infusion well with some mild to moderate
nausea without vomiting.

The patient will return in 4 weeks for Sandostatin injection and 8
weeks for second therapy.

## 2021-06-25 IMAGING — NM NM RADIOLOGIST EVAL AND MGMT
1 series · 1 of 1 positions shown · non-contrast
Comparison: none

[Series 1: static · 2.07mm/px · 1 of 1 slices shown]
[im 1/1]
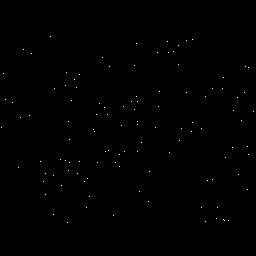

[1 of 1 positions shown; findings below may reference images not displayed]

EXAM:
ESTABLISHED PATIENT OFFICE VISIT

CHIEF COMPLAINT:
Well differentiated neuroendocrine tumor. Monthly Sandostatin
injection.

Current Pain Level: 1-10

HISTORY OF PRESENT ILLNESS:
Well differentiated neuroendocrine tumor. Liver metastasis.
Undergoing peptide receptor radiotherapy. Monthly Sandostatin
injection.

REVIEW OF SYSTEMS:
Defered

PHYSICAL EXAMINATION:
Deferred

ASSESSMENT AND PLAN:
60 mg intramuscular injection of Sandostatin.

Follow-up in 4 weeks for second peptide receptor radiotherapy and
Sandostatin injection.

## 2021-09-25 IMAGING — NM NM [PERSON_NAME] ADMINISTRATION
1 series · 1 of 1 positions shown · non-contrast
Comparison: none

CLINICAL DATA: [age] year-old male with metastatic
neuroendocrine tumor. Well differentiated tumor with somatostatin
receptor is identified within the liver and mesentery by DOTATATE
PET CT scan. Ongoing peptide receptor radiotherapy.

EXAM:
NUCLEAR MEDICINE LUTATHERA ADMINISTRATION
TECHNIQUE: Infusion: The nuclear medicine technologist and I personally
verified the dose activity (201 mCi) to be delivered as specified in
the written directive (200 mCi), and verified the patient
identification via 2 separate methods. 20 gauge IV were started in
the antecubital veins. Anti-emetics were administered by nursing
staff. Romanno acid renal protection was initiated 30 minutes prior to
Lu 177 DOTATATE (Lutathera) infusion and continued continuously for
4 hours. Lutathera infusion was administered over 30 minutes.

[Series 1: static · 2.07mm/px · 1 of 1 slices shown]
[im 1/1]
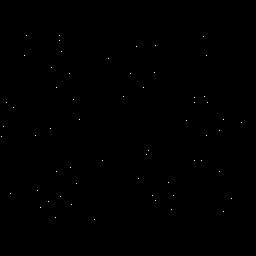

[1 of 1 positions shown; findings below may reference images not displayed]

The total administered dose was 199 mCi Lu 177 DOTATATE.

The entire IV tubing, venocatheter, stopcock and syringes was
removed in total, placed in a disposal bag and sent for assay of the
residual activity, which will be reported at a later time in our EMR
by the physics staff. Pressure was applied to the venipuncture
sites, and a compression bandage placed. Radiation Safety personnel
were present to perform the discharge survey, as detailed on their
documentation.

Patient received 60 mg IM long-acting Sandostatin injection 4 hours
after Lutathera effusion in the nuclear medicine department.

RADIOPHARMACEUTICALS:  One hundred ninety-nine mCi Lu 177 DOTATATE
FINDINGS: Diagnosis: Metastatic neuroendocrine tumor.

Current Infusion: 3

Planned Infusions: 4

Patient reports minimal interval symptoms following therapy. The
patient's most recent blood counts were reviewed and remains a good
candidate to proceed with Lutathera. Patient's white blood cell
count and platelets remain mildly depressed but not changed from 2
month prior (white blood cell count equals 3.3K and platelets equal
128 K). Counts mildly depressed from pre therapy values (white blood
cell count equal 4.7 platelets equal 189 on 05/30/2020)

As previously patient was given Ativan 1 mg for anxiety and 10 mg
Compazine for nausea prevention which occurred during the first
treatment.

The patient was situated in an infusion suite and administered
Lutathera as above. Patient will follow-up with referring oncologist
for interval serum laboratories (CBC and CMP) in approximately 4
weeks.

Patient received 30 mg IM long-acting Sandostatin injection 4 hours
after Lutathera effusion in the nuclear medicine department.
IMPRESSION: Third Lu 177 DOTATATE treatment for metastatic neuroendocrine tumor.
The patient tolerated the infusion well. The patient will return in
4 weeks for Sandostatin injection and laboratory evaluation.

## 2022-01-22 IMAGING — NM NM RADIOLOGIST EVAL AND MGMT
1 series · 1 of 1 positions shown · non-contrast
Comparison: none

[Series 1: static · 2.07mm/px · 1 of 1 slices shown]
[im 1/1]
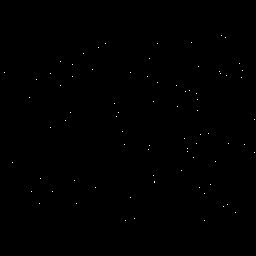

[1 of 1 positions shown; findings below may reference images not displayed]

EXAM:
ESTABLISHED PATIENT OFFICE VISIT

CHIEF COMPLAINT:
Post peptide receptor radiotherapy for well differentiated
neuroendocrine tumor.

Current Pain Level: See [REDACTED]

HISTORY OF PRESENT ILLNESS:
Completed 4 cycles of peptide receptor radiotherapy. Final infusion
date 11/22/2020. Patient received 4 doses of 200 millicurie Lu 177
Dotatate without event.

REVIEW OF SYSTEMS:
See [REDACTED] note

PHYSICAL EXAMINATION:
See [REDACTED] note

ASSESSMENT AND PLAN:
1. Patient status post peptide receptor radiotherapy. Positive
response on DOTATATE PET scan (01/24/2021) with reduction size and
radiotracer activity hepatic metastasis. Mesenteric metastatic
disease is stable. No evidence disease progression.
2. Measurable positive response to peptide receptor radiotherapy has
been shown to continue for months following cessation of the initial
treatment.
3. Patient tolerated procedure well. No evidence of significant
marrow suppression, renal toxicity or liver toxicity. Most recent
labs 12/22/2020 demonstrated improved white blood cell counts and
decreased bilirubin.
